# Patient Record
Sex: Male | Born: 1965 | Race: White | Hispanic: No | Marital: Single | Smoking: Never smoker
Health system: Southern US, Community
[De-identification: ages and names within clinical notes are randomized; demographics above are authoritative.]

## PROBLEM LIST (undated history)

## (undated) DIAGNOSIS — Z9049 Acquired absence of other specified parts of digestive tract: Secondary | ICD-10-CM

## (undated) DIAGNOSIS — Z789 Other specified health status: Secondary | ICD-10-CM

## (undated) DIAGNOSIS — I10 Essential (primary) hypertension: Secondary | ICD-10-CM

## (undated) DIAGNOSIS — M4726 Other spondylosis with radiculopathy, lumbar region: Secondary | ICD-10-CM

## (undated) DIAGNOSIS — M4316 Spondylolisthesis, lumbar region: Secondary | ICD-10-CM

## (undated) DIAGNOSIS — R072 Precordial pain: Secondary | ICD-10-CM

## (undated) DIAGNOSIS — E119 Type 2 diabetes mellitus without complications: Secondary | ICD-10-CM

## (undated) DIAGNOSIS — M51369 Other intervertebral disc degeneration, lumbar region without mention of lumbar back pain or lower extremity pain: Secondary | ICD-10-CM

## (undated) DIAGNOSIS — G4700 Insomnia, unspecified: Secondary | ICD-10-CM

## (undated) DIAGNOSIS — F32A Depression, unspecified: Secondary | ICD-10-CM

## (undated) DIAGNOSIS — E785 Hyperlipidemia, unspecified: Secondary | ICD-10-CM

## (undated) DIAGNOSIS — Z8619 Personal history of other infectious and parasitic diseases: Secondary | ICD-10-CM

## (undated) DIAGNOSIS — F419 Anxiety disorder, unspecified: Secondary | ICD-10-CM

## (undated) DIAGNOSIS — I2510 Atherosclerotic heart disease of native coronary artery without angina pectoris: Secondary | ICD-10-CM

## (undated) DIAGNOSIS — C801 Malignant (primary) neoplasm, unspecified: Secondary | ICD-10-CM

## (undated) DIAGNOSIS — N529 Male erectile dysfunction, unspecified: Secondary | ICD-10-CM

## (undated) DIAGNOSIS — I4510 Unspecified right bundle-branch block: Secondary | ICD-10-CM

## (undated) HISTORY — DX: Type 2 diabetes mellitus without complications: E11.9

## (undated) HISTORY — PX: COLON SURGERY: SHX602

---

## 1999-08-21 DIAGNOSIS — C801 Malignant (primary) neoplasm, unspecified: Secondary | ICD-10-CM

## 1999-08-21 HISTORY — PX: COLON SURGERY: SHX602

## 1999-08-21 HISTORY — DX: Malignant (primary) neoplasm, unspecified: C80.1

## 2000-04-19 ENCOUNTER — Ambulatory Visit (HOSPITAL_COMMUNITY): Admission: RE | Admit: 2000-04-19 | Discharge: 2000-04-19 | Payer: Self-pay | Admitting: Gastroenterology

## 2000-05-28 ENCOUNTER — Encounter: Payer: Self-pay | Admitting: Surgery

## 2000-05-29 ENCOUNTER — Inpatient Hospital Stay (HOSPITAL_COMMUNITY): Admission: RE | Admit: 2000-05-29 | Discharge: 2000-06-03 | Payer: Self-pay | Admitting: Surgery

## 2000-05-29 ENCOUNTER — Encounter (INDEPENDENT_AMBULATORY_CARE_PROVIDER_SITE_OTHER): Payer: Self-pay | Admitting: *Deleted

## 2001-12-09 ENCOUNTER — Ambulatory Visit (HOSPITAL_COMMUNITY): Admission: RE | Admit: 2001-12-09 | Discharge: 2001-12-09 | Payer: Self-pay | Admitting: Gastroenterology

## 2001-12-09 ENCOUNTER — Encounter (INDEPENDENT_AMBULATORY_CARE_PROVIDER_SITE_OTHER): Payer: Self-pay | Admitting: Specialist

## 2005-03-26 ENCOUNTER — Ambulatory Visit: Payer: Self-pay | Admitting: Internal Medicine

## 2005-05-24 ENCOUNTER — Ambulatory Visit: Payer: Self-pay | Admitting: Internal Medicine

## 2008-04-21 ENCOUNTER — Ambulatory Visit: Payer: Self-pay | Admitting: Cardiovascular Disease

## 2010-05-26 ENCOUNTER — Emergency Department: Payer: Self-pay | Admitting: Emergency Medicine

## 2010-08-23 ENCOUNTER — Ambulatory Visit
Admission: RE | Admit: 2010-08-23 | Discharge: 2010-08-23 | Payer: Self-pay | Source: Home / Self Care | Attending: Psychology | Admitting: Psychology

## 2010-08-31 ENCOUNTER — Ambulatory Visit: Admit: 2010-08-31 | Payer: Self-pay | Admitting: Psychology

## 2011-01-05 NOTE — Op Note (Signed)
[name]. [facility]  Patient:    Keith Dorsey, Keith Dorsey                       MRN: [id number] Proc. Date: 05/29/00 Adm. Date:  [id number] Attending:  Bonnetta Barry CC:         Griffith Citron, M.D.  Dr. Jettie Pagan, [facility]  Velora Heckler, M.D.   Operative Report  PREOPERATIVE DIAGNOSIS:  Sigmoid diverticular disease.  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  OPERATION PERFORMED: 1. Sigmoid colectomy. 2. Small bowel resection. 3. Appendectomy. 4. Liver wedge biopsy.  SURGEON:  Velora Heckler, M.D.  ASSISTANT:  Chevis Pretty, M.D.  ANESTHESIA:  General with postoperative epidural per Dr. Noreene Larsson.  ESTIMATED BLOOD LOSS:  100 cc.  PREPARATION:  Betadine.  COMPLICATIONS:  None.  INDICATIONS FOR PROCEDURE:  The patient is a 45 year old white male who presents with a 1-1/2 year history of diverticulitis.  The patient has undergone an extensive work-up.  He now comes to surgery for sigmoid colectomy.  The patient has also had abnormal liver function tests which have persisted over a several month interval.  He also desires liver biopsy.  DESCRIPTION OF PROCEDURE:  The procedure was done in OR #16 at the [facility].  The patient was brought to the operating room and placed in supine position on the operating room table.  Following administration of general anesthesia, the patient was prepped and draped in the usual strict aseptic fashion.  After ascertaining that an adequate level of anesthesia had been obtained, a midline incision was made with a #10 blade. Dissection was carried down through the subcutaneous tissues to the fascia. Hemostasis was obtained with the electrocautery.  Fascia was incised in the midline and the peritoneal cavity was entered cautiously.  The abdomen was explored.  There is an obvious inflammatory mass in the  distal sigmoid colon. This was adherent to the posterior wall of the bladder.  It also has adherent small bowel.  Small bowel was separated from the mass with sharp dissection using the Metzenbaum scissors.  There appears to be a fistulous tract connecting the two.  The remainder of the abdomen is normal to palpation.  The fistulous tract is in the distal ileum.  The cecum appears grossly normal as does the appendix.  The liver is normal.  Stomach is normal.  The remainder of the small bowel is normal.  Balfour retractor was placed for exposure.  We began by incising the peritoneum lateral to the sigmoid colon and up the white line of Toldt.  A point in the proximal sigmoid colon was selected and transected between bowel clamps.  The mesentery was divided between [facility] clamps and ligated with 2-0 silk ties and 2-0 silk suture ligatures. Dissection was carried distal to the inflammatory segment into the distal sigmoid colon.  Again, the bowel was transected between bowel clamps and the specimen passed off the field.  This was reviewed intraoperatively by Berneta Levins, M.D., who felt that this was consistent with diverticular disease.  Next, an end-to-end anastomosis was created with a single layer of hand sewn 3-0 silk sutures.  There was no tension on the anastomosis.  The segment of small bowel in the distal ileum was then resected.  The peritoneum of the mesentery was incised with the electrocautery on both sides. The small bowel immediately proximal to the fistulous segment was  transected with the GIA stapler.  The small bowel immediately distal to the fistulous segment approximately 10 cm from the ileocecal valve was transected with a GIA stapler.  The mesentery was divided between Spray clamps and ligated with 2-0 silk ties.  The segment was submitted to pathology for review.  A side-to-side functional end-to-end anastomosis was then created  between the two ends of the terminal ileum.   This was performed with a GIA type stapler.  Staple was inspected for hemostasis.  Enterotomy was closed with interrupted 3-0 silk sutures.  The mesenteric defect was closed with interrupted 3-0 silk sutures. Next we performed appendectomy.  The appendix was mobilized on the appendiceal mesentery.  The appendiceal artery was divided between hemostats and ligated with 2-0 silk tie.  The appendiceal mesentery was divided between [facility] clamps and ligated with 2-0 silk tie.  The base of the appendix was ligated with a 0 chromic gut suture.  The stump of the appendix was inverted with a 3-0 silk pursestring suture.  The appendix was passed off the field and submitted to pathology.  Finally, the midline incision was extended slightly cephalad. This provided for adequate exposure of the left lateral segment of the liver. Two 2-0 silk figure-of-eight sutures were placed.  Using a knife, a wedge of liver parenchyma was excised between the sutures.  Hemostasis was obtained with the electrocautery. These silk sutures were tied together to close the injury to the liver.  Good hemostasis was maintained.  The abdomen was copiously irrigated with warm saline which was evacuated.  All sponge counts were correct.  The midline wound was closed with a running #1 Novofil suture. Subcutaneous tissues were irrigated and hemostasis obtained with the electrocautery.  Skin was closed with stainless steel staples. Sterile occlusive dressings were applied.  The patient was awakened from anesthesia following placement of a postoperative epidural catheter by Dr. Noreene Larsson.  the patient was brought to the recovery room in stable condition.  The patient tolerated the procedure well. DD:  05/29/00 TD:  05/30/00 Job: [id number]

## 2011-01-05 NOTE — Discharge Summary (Signed)
[place]. [facility]  Patient:    Keith Dorsey, Keith Dorsey                       MRN: [id number] Adm. Date:  [id number] Disc. Date: 06/03/00 Attending:  Bonnetta Barry CC:         Griffith Citron, M.D.  Dr. 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  REASON FOR ADMISSION:  Diverticular disease of the sigmoid colon.  BRIEF HISTORY:  The patient is a 45 year old white male who presents with a one and a half year history of problems associated with diverticular disease and diverticulitis of the sigmoid colon. The patient has been on multiple regimens of oral antibiotics. He was seen and evaluated by Dr. Sharrell Ku, and treated with intravenous antibiotics. Followup CT scan showed persistence of his diverticular disease. Colonoscopy demonstrated a limited segment of diverticular disease involving the sigmoid colon. The patient is admitted, at this time, for sigmoid resection.  HOSPITAL COURSE:  The patient was admitted on the morning of May 29, 2000. He was taken directly to the operating room where he underwent sigmoid colectomy, small bowel resection, liver wedge biopsy and incidental appendectomy. At surgery he was found to have a short segment of diverticular disease with a fistulous connection to the terminal ileum.  The postoperative course was relatively straight forward. The patient had postoperative epidural for analgesia. He was begun on a clear liquid diet on the second postoperative day. He was advanced to a regular diet by the fourth postoperative day. He was prepared for discharge home on the fifth postoperative day.  DISCHARGE PLANNING:  The patient is discharged home today, June 03, 2000, in good condition, tolerating a regular diet, and ambulating independently. He will be seen back in my office at [facility] in three to five days for a wound check and staple removal.  DISCHARGE MEDICATIONS: 1. Tylox as needed for pain. 2. Seconal as needed for sleep.  CONDITION AT DISCHARGE:  Good. DD:  06/03/00 TD:  06/03/00 Job: [id number]

## 2011-01-05 NOTE — Procedures (Signed)
[facility]  Patient:    Keith Dorsey, Keith Dorsey                       MRN: [id number] Proc. Date: 04/19/00 Adm. Date:  [id number] Disc. Date: [id number] Attending:  Deneen Harts CC:         Dr. Jettie Pagan   Procedure Report  PROCEDURE:  Colonoscopy.  SURGEON:  Griffith Citron, M.D.  INDICATIONS:  A 45 year old white male undergoing colonoscopy to further evaluate recurrent episodes of left lower quadrant abdominal pain, alternating diarrhea, constipation, and a barium enema suggesting a possible mass or extrinsic compression in the region of the rectosigmoid.  DESCRIPTION OF PROCEDURE:  After reviewing the nature of the procedure with the patient including potential risks and complications, and after discussing alternative methods of diagnosis and treatment, informed consent was signed.  The patient was premedicated reviewing IV sedation totalling Versed 10 mg and fentanyl 100 mcg administered in divided doses prior to the onset of the procedure.  Using the Olympus PCF-140L pediatric video colonoscope, the rectum was intubated.  After a normal digital examination with normal prostate.  The scope was inserted and easily advanced through the entire length of the colon to the cecum, identified by the appendiceal orifice and the ileocecal valve.  The scope was slowly withdrawn with careful inspection after the terminal ileum was intubated.  Preparation was excellent throughout.  The only abnormality noted was in the region of the rectosigmoid colon.  In this region, over approximately 25 cm, from 25-30 cm, the mucosa was edematous, erythematous, and minimally friable.  There was purulent exudate, though no specific lesion identified.  There was suggestion of an extrinsic compression, though this was not marked.  Diverticuli were noted in the vicinity, but they were all full of purulent material in an identical manner without point source.  The  rectum was normal including retroflexed view.  The colon was decompressed and the scope withdrawn.  The patient tolerated the procedure without difficulty, being maintained on datascope monitor, and maintained on low flow oxygen throughout.  Returned to recovery room in stable condition.  Time 1, technical 1, preparation 1, and total score equalled 3.  ASSESSMENT: 1. Probable acute diverticulitis with possible diverticular abscess in the    region in the rectosigmoid. 2. Diverticulosis - sigmoid, well-localized.  RECOMMENDATIONS: 1. Cipro 250 mg p.o. b.i.d. 2. Consider repeat CT scan. 3. Return office visit in 1-2 weeks to reassess symptoms. 4. CBC with differential, sedimentation rate, and CMET. DD:  04/19/00 TD:  04/22/00 Job: [id number]

## 2011-01-05 NOTE — Procedures (Signed)
. [facility]  Patient:    Keith Dorsey, Keith Dorsey                       MRN: [id number] Proc. Date: 05/29/00 Adm. Date:  [id number] Attending:  Bonnetta Barry                           Procedure Report  PROCEDURE:  Lumbar epidural catheter for postoperative pain control.  INDICATIONS:  [name] is a 45 year old white male with a history of chronic colonic diverticulitis.  He underwent a subtotal colectomy by Velora Heckler, M.D., under general anesthesia.  Prior to the procedure, the risks and benefits of epidural pain control were discussed with the patient.  The risks included bleeding, headache, injection, nerve root injury, and spinal cord injury.  The patient understood these risks and agreed to proceed with this form of pain control following surgery.  DESCRIPTION OF PROCEDURE:  Upon completion of the procedure while the patient was still under general endotracheal anesthesia, he was turned to the right lateral decubitus position and the low back was prepped and draped sterilely. The L3-4 interspace was entered in the midline using an 18 gauge Tuohy needle. One pass of the needle was required to enter the epidural space using loss of resistance with air.  Aspiration of the needle was negative for blood or CSF. Preservative-free saline 10 cc with 50 mg of 1% lidocaine added was injected through the needle without difficulty.  The catheter was then threaded 4 cm into the epidural space.  The needle was removed without difficulty and the catheter was taped securely in place.  The patient was then extubated and brought to the recovery room in stable condition.  He was able to be begun on an epidural fentanyl and Marcaine infusion to be followed on a daily basis by the anesthesia service. DD:  05/29/00 TD:  05/29/00 Job: [id number]

## 2012-12-22 ENCOUNTER — Emergency Department: Payer: Self-pay | Admitting: Emergency Medicine

## 2012-12-22 LAB — CK TOTAL AND CKMB (NOT AT ARMC)
CK, Total: 106 U/L (ref 35–232)
CK, Total: 93 U/L (ref 35–232)
CK-MB: 1 ng/mL (ref 0.5–3.6)
CK-MB: 1 ng/mL (ref 0.5–3.6)

## 2012-12-22 LAB — TROPONIN I
Troponin-I: <0.02 ng/mL
Troponin-I: <0.02 ng/mL

## 2012-12-22 LAB — COMPREHENSIVE METABOLIC PANEL
Albumin: 3.8 g/dL (ref 3.4–5.0)
Alkaline Phosphatase: 82 U/L (ref 50–136)
Anion Gap: 6 — ABNORMAL LOW (ref 7–16)
BUN: 14 mg/dL (ref 7–18)
Bilirubin,Total: 0.3 mg/dL (ref 0.2–1.0)
Calcium, Total: 9.3 mg/dL (ref 8.5–10.1)
Chloride: 103 mmol/L (ref 98–107)
Co2: 30 mmol/L (ref 21–32)
Creatinine: 0.96 mg/dL (ref 0.60–1.30)
EGFR (African American): >60
EGFR (Non-African Amer.): >60
Glucose: 140 mg/dL — ABNORMAL HIGH (ref 65–99)
Osmolality: 280 (ref 275–301)
Potassium: 3.4 mmol/L — ABNORMAL LOW (ref 3.5–5.1)
SGOT(AST): 35 U/L (ref 15–37)
SGPT (ALT): 58 U/L (ref 12–78)
Sodium: 139 mmol/L (ref 136–145)
Total Protein: 8 g/dL (ref 6.4–8.2)

## 2012-12-22 LAB — CBC
HCT: 44 % (ref 40.0–52.0)
HGB: 15.1 g/dL (ref 13.0–18.0)
MCH: 31.1 pg (ref 26.0–34.0)
MCHC: 34.3 g/dL (ref 32.0–36.0)
MCV: 91 fL (ref 80–100)
Platelet: 193 10*3/uL (ref 150–440)
RBC: 4.86 10*6/uL (ref 4.40–5.90)
RDW: 13 % (ref 11.5–14.5)
WBC: 8.2 10*3/uL (ref 3.8–10.6)

## 2014-01-26 ENCOUNTER — Ambulatory Visit: Payer: Self-pay | Admitting: Family Medicine

## 2014-02-17 ENCOUNTER — Ambulatory Visit: Payer: Self-pay | Admitting: Family Medicine

## 2015-03-21 DIAGNOSIS — E119 Type 2 diabetes mellitus without complications: Secondary | ICD-10-CM | POA: Insufficient documentation

## 2015-06-14 ENCOUNTER — Other Ambulatory Visit: Payer: Self-pay | Admitting: Family Medicine

## 2015-06-30 ENCOUNTER — Emergency Department (HOSPITAL_COMMUNITY)
Admission: EM | Admit: 2015-06-30 | Discharge: 2015-06-30 | Disposition: A | Discharge status: Home / Self Care | Payer: BLUE CROSS/BLUE SHIELD | Attending: Emergency Medicine | Admitting: Emergency Medicine

## 2015-06-30 ENCOUNTER — Emergency Department (HOSPITAL_COMMUNITY): Payer: BLUE CROSS/BLUE SHIELD

## 2015-06-30 ENCOUNTER — Encounter (HOSPITAL_COMMUNITY): Payer: Self-pay | Admitting: *Deleted

## 2015-06-30 DIAGNOSIS — R51 Headache: Secondary | ICD-10-CM | POA: Diagnosis not present

## 2015-06-30 DIAGNOSIS — R358 Other polyuria: Secondary | ICD-10-CM | POA: Insufficient documentation

## 2015-06-30 DIAGNOSIS — Z85038 Personal history of other malignant neoplasm of large intestine: Secondary | ICD-10-CM | POA: Insufficient documentation

## 2015-06-30 DIAGNOSIS — A770 Spotted fever due to Rickettsia rickettsii: Secondary | ICD-10-CM

## 2015-06-30 DIAGNOSIS — R112 Nausea with vomiting, unspecified: Secondary | ICD-10-CM | POA: Insufficient documentation

## 2015-06-30 DIAGNOSIS — R0602 Shortness of breath: Secondary | ICD-10-CM | POA: Insufficient documentation

## 2015-06-30 DIAGNOSIS — R509 Fever, unspecified: Secondary | ICD-10-CM | POA: Diagnosis present

## 2015-06-30 DIAGNOSIS — R634 Abnormal weight loss: Secondary | ICD-10-CM | POA: Diagnosis not present

## 2015-06-30 DIAGNOSIS — Z79899 Other long term (current) drug therapy: Secondary | ICD-10-CM | POA: Insufficient documentation

## 2015-06-30 HISTORY — DX: Malignant (primary) neoplasm, unspecified: C80.1

## 2015-06-30 LAB — URINALYSIS, ROUTINE W REFLEX MICROSCOPIC
Bilirubin Urine: NEGATIVE
GLUCOSE, UA: 250 mg/dL — AB
Hgb urine dipstick: NEGATIVE
Ketones, ur: NEGATIVE mg/dL
LEUKOCYTES UA: NEGATIVE
NITRITE: NEGATIVE
PH: 6 (ref 5.0–8.0)
Protein, ur: NEGATIVE mg/dL
SPECIFIC GRAVITY, URINE: 1.016 (ref 1.005–1.030)
UROBILINOGEN UA: 1 mg/dL (ref 0.0–1.0)

## 2015-06-30 LAB — CBC WITH DIFFERENTIAL/PLATELET
BASOS PCT: 2 %
Basophils Absolute: 0.1 10*3/uL (ref 0.0–0.1)
Eosinophils Absolute: 0.1 10*3/uL (ref 0.0–0.7)
Eosinophils Relative: 2 %
HEMATOCRIT: 42.9 % (ref 39.0–52.0)
HEMOGLOBIN: 14.7 g/dL (ref 13.0–17.0)
LYMPHS ABS: 1.3 10*3/uL (ref 0.7–4.0)
Lymphocytes Relative: 29 %
MCH: 30.9 pg (ref 26.0–34.0)
MCHC: 34.3 g/dL (ref 30.0–36.0)
MCV: 90.3 fL (ref 78.0–100.0)
MONO ABS: 0.5 10*3/uL (ref 0.1–1.0)
MONOS PCT: 11 %
NEUTROS ABS: 2.5 10*3/uL (ref 1.7–7.7)
NEUTROS PCT: 55 %
Platelets: 86 10*3/uL — ABNORMAL LOW (ref 150–400)
RBC: 4.75 MIL/uL (ref 4.22–5.81)
RDW: 12.4 % (ref 11.5–15.5)
WBC: 4.6 10*3/uL (ref 4.0–10.5)

## 2015-06-30 LAB — COMPREHENSIVE METABOLIC PANEL
ALBUMIN: 3.2 g/dL — AB (ref 3.5–5.0)
ALK PHOS: 62 U/L (ref 38–126)
ALT: 57 U/L (ref 17–63)
ANION GAP: 9 (ref 5–15)
AST: 73 U/L — ABNORMAL HIGH (ref 15–41)
BILIRUBIN TOTAL: 0.8 mg/dL (ref 0.3–1.2)
BUN: 11 mg/dL (ref 6–20)
CALCIUM: 9.1 mg/dL (ref 8.9–10.3)
CO2: 28 mmol/L (ref 22–32)
Chloride: 96 mmol/L — ABNORMAL LOW (ref 101–111)
Creatinine, Ser: 0.96 mg/dL (ref 0.61–1.24)
Glucose, Bld: 224 mg/dL — ABNORMAL HIGH (ref 65–99)
POTASSIUM: 3.7 mmol/L (ref 3.5–5.1)
Sodium: 133 mmol/L — ABNORMAL LOW (ref 135–145)
TOTAL PROTEIN: 7.3 g/dL (ref 6.5–8.1)

## 2015-06-30 MED ORDER — HYDROMORPHONE HCL 1 MG/ML IJ SOLN
1.0000 mg | Freq: Once | INTRAMUSCULAR | Status: AC
  Administered 2015-06-30: 1 mg via INTRAVENOUS
  Filled 2015-06-30: qty 1

## 2015-06-30 MED ORDER — PROMETHAZINE HCL 25 MG/ML IJ SOLN
12.5000 mg | Freq: Once | INTRAMUSCULAR | Status: AC
  Administered 2015-06-30: 12.5 mg via INTRAVENOUS
  Filled 2015-06-30: qty 1

## 2015-06-30 MED ORDER — IOHEXOL 300 MG/ML  SOLN
100.0000 mL | Freq: Once | INTRAMUSCULAR | Status: AC | PRN
  Administered 2015-06-30: 100 mL via INTRAVENOUS

## 2015-06-30 MED ORDER — ACETAMINOPHEN 325 MG PO TABS
650.0000 mg | ORAL_TABLET | Freq: Once | ORAL | Status: DC
  Filled 2015-06-30: qty 2

## 2015-06-30 MED ORDER — OXYCODONE HCL 5 MG PO CAPS: 5.0000 mg | ORAL_CAPSULE | ORAL | Status: DC | PRN

## 2015-06-30 MED ORDER — FENTANYL CITRATE (PF) 100 MCG/2ML IJ SOLN: 50.0000 ug | Freq: Once | INTRAMUSCULAR | Status: DC

## 2015-06-30 MED ORDER — SODIUM CHLORIDE 0.9 % IV BOLUS (SEPSIS)
1000.0000 mL | Freq: Once | INTRAVENOUS | Status: AC
Start: 2015-06-30 — End: 2015-06-30
  Administered 2015-06-30: 1000 mL via INTRAVENOUS

## 2015-06-30 MED ORDER — DEXTROSE 5 % IV SOLN
100.0000 mg | Freq: Once | INTRAVENOUS | Status: DC
Start: 2015-06-30 — End: 2015-07-01
  Filled 2015-06-30: qty 100

## 2015-06-30 MED ORDER — DOXYCYCLINE HYCLATE 100 MG PO TABS: 100.0000 mg | ORAL_TABLET | Freq: Two times a day (BID) | ORAL | Status: DC

## 2015-06-30 NOTE — ED Provider Notes (Signed)
CSN: [id number]     Arrival date & time 06/30/15  1438 History   First MD Initiated Contact with Patient 06/30/15 1612     Chief Complaint  Patient presents with  . Fever  . Headache     (Consider location/radiation/quality/duration/timing/severity/associated sxs/prior Treatment) The history is provided by the patient.   Patient is a 49yo male with history of colon cancer s/p resection in 2001 and T2DM presenting with fever and headache for 3 days. Patient describes the pain as "pressure" located in the front of his head. Pain waxes and wanes, though is constant in nature. Patient has never had a headache like this before. Patient denies any acute vision disturbances, weakness, numbness, or syncope. Patient has tried taking aleve which helps for a couple of hours then wears off. Patient has not noticed anything else that makes the pain better or worse. Mild photophobia. Patient also reports high grade fever for the past 3 days. States that his temperature has been up to 101-103F orally. Headache and fever is usually worse at night. Patient also reports an unintentional 15 pound weight loss over the past month. No tick bites. No sick contacts. Patient did have one episode of emesis yesterday. Patient went to urgent care yesterday with the same complaints and reports that he was only told that he had a normal white count, low platelets, and slightly elevated liver enzymes. Of note, patient's wife reports that she pulled 2 engorged ticks off their dog earlier this week.    Past Medical History  Diagnosis Date  . Cancer [facility])    Past Surgical History  Procedure Laterality Date  . Colon surgery     No family history on file. Social History  Substance Use Topics  . Smoking status: Never Smoker   . Smokeless tobacco: None  . Alcohol Use: 15.0 - 18.0 oz/week    25-30 Cans of beer per week    Review of Systems  Constitutional: Positive for fever and unexpected weight change.  Eyes:  Negative.   Respiratory: Positive for shortness of breath (with high fever).   Cardiovascular: Negative.   Gastrointestinal: Positive for nausea and vomiting.  Endocrine: Positive for polyuria.  Genitourinary: Negative for dysuria and frequency.  Musculoskeletal: Negative.   Skin: Negative.   Neurological: Positive for headaches. Negative for dizziness, seizures, syncope, weakness, light-headedness and numbness.  Hematological: Negative.   Psychiatric/Behavioral: Negative.       Allergies  Other  Home Medications   Prior to Admission medications   Medication Sig Start Date End Date Taking? Authorizing Provider  diphenhydrAMINE (BENADRYL) 50 MG capsule Take 50 mg by mouth at bedtime. Take every night per patient   Yes Historical Provider, MD  sertraline (ZOLOFT) 50 MG tablet Take 50 mg by mouth daily.   Yes Historical Provider, MD  doxycycline (VIBRA-TABS) 100 MG tablet Take 1 tablet (100 mg total) by mouth 2 (two) times daily. 06/30/15   Vivi Barrack, MD  oxycodone (OXY-IR) 5 MG capsule Take 1 capsule (5 mg total) by mouth every 4 (four) hours as needed. 06/30/15   Vivi Barrack, MD   BP 144/100 mmHg  Pulse 65  Temp(Src) 99.4 F (37.4 C) (Oral)  Resp 18  Ht 5\' 8"  (1.727 m)  Wt 194 lb 2 oz (88.055 kg)  BMI 29.52 kg/m2  SpO2 92% Physical Exam  Constitutional: He is oriented to person, place, and time. He appears well-developed and well-nourished. No distress.  HENT:  Head: Normocephalic and atraumatic.  Eyes:  EOM are normal. Pupils are equal, round, and reactive to light.  Neck: Normal range of motion. Neck supple.  Cardiovascular: Normal rate, regular rhythm and normal heart sounds.   Pulmonary/Chest: Effort normal and breath sounds normal. No respiratory distress. He has no wheezes.  Abdominal: Soft. Bowel sounds are normal. He exhibits no distension. There is tenderness. There is positive Murphy's sign.  Musculoskeletal: Normal range of motion. He exhibits no edema.   Neurological: He is alert and oriented to person, place, and time. He has normal strength. No cranial nerve deficit or sensory deficit.  Skin: Skin is warm and dry. No rash noted.  Psychiatric: He has a normal mood and affect. His behavior is normal.  Nursing note and vitals reviewed.   ED Course  Procedures (including critical care time) Labs Review Labs Reviewed  CBC WITH DIFFERENTIAL/PLATELET - Abnormal; Notable for the following:    Platelets 86 (*)    All other components within normal limits  COMPREHENSIVE METABOLIC PANEL - Abnormal; Notable for the following:    Sodium 133 (*)    Chloride 96 (*)    Glucose, Bld 224 (*)    Albumin 3.2 (*)    AST 73 (*)    All other components within normal limits  URINALYSIS, ROUTINE W REFLEX MICROSCOPIC (NOT AT [facility]) - Abnormal; Notable for the following:    Glucose, UA 250 (*)    All other components within normal limits    Imaging Review Dg Chest 2 View  06/30/2015  CLINICAL DATA:  Chest pain and fever. Shortness of breath and headaches for 3 days. EXAM: CHEST  2 VIEW COMPARISON:  12/22/2012 FINDINGS: The cardiac silhouette, mediastinal and hilar contours are normal and stable. The lungs are clear. Stable mild eventration of the right hemidiaphragm. No pleural effusion. The bony thorax is intact. IMPRESSION: No acute cardiopulmonary findings. Electronically Signed   By: Marijo Sanes M.D.   On: 06/30/2015 17:20   Ct Head Wo Contrast  06/30/2015  CLINICAL DATA:  Frontal headache with blurred vision ; fever for 3 days EXAM: CT HEAD WITHOUT CONTRAST TECHNIQUE: Contiguous axial images were obtained from the base of the skull through the vertex without intravenous contrast. COMPARISON:  None. FINDINGS: The ventricles are normal in size and configuration. There is no intracranial mass, hemorrhage, extra-axial fluid collection, or midline shift. Gray-white compartments are normal. No acute infarct evident. The bony calvarium appears intact. The  mastoid air cells are clear. IMPRESSION: Study within normal limits. Electronically Signed   By: Lowella Grip III M.D.   On: 06/30/2015 17:01   Ct Abdomen Pelvis W Contrast  06/30/2015  CLINICAL DATA:  Fever and abdominal pain for 4 days. EXAM: CT ABDOMEN AND PELVIS WITH CONTRAST TECHNIQUE: Multidetector CT imaging of the abdomen and pelvis was performed using the standard protocol following bolus administration of intravenous contrast. CONTRAST:  122mL OMNIPAQUE IOHEXOL 300 MG/ML  SOLN COMPARISON:  None. FINDINGS: Lower chest: The lung bases are clear of acute process. No pleural effusion or pulmonary lesions. The heart is normal in size. No pericardial effusion. Coronary artery calcifications are noted. The distal esophagus and aorta are unremarkable. Hepatobiliary: Diffuse fatty infiltration of the liver but no focal hepatic lesions or intrahepatic biliary dilatation. The gallbladder is normal. No common bile duct dilatation. Pancreas: Normal. Spleen: Normal size.  No focal lesions. Adrenals/Urinary Tract: The adrenal glands are normal. Both kidneys are normal except for small cysts. No hydronephrosis. No obstructing ureteral calculi or bladder calculi. No renal or  bladder mass. Stomach/Bowel: The stomach, duodenum, small bowel and colon are unremarkable. No inflammatory changes, mass lesions or obstructive findings. The terminal ileum is normal. Prior appendectomy. Vascular/Lymphatic: No mesenteric or retroperitoneal mass or adenopathy. Small scattered lymph nodes are noted. The aorta is normal in caliber. The branch vessels are patent. Minimal scattered atherosclerotic calcifications. The major venous structures are patent. Other: The bladder, prostate gland and seminal vesicles are unremarkable. No pelvic mass or adenopathy. No free pelvic fluid collections. No inguinal mass or adenopathy. Musculoskeletal: No significant bony findings. Bilateral pars defects are noted at L3 but no spondylolisthesis.  IMPRESSION: No acute abdominal/ pelvic findings, mass lesions or adenopathy. Diffuse fatty infiltration of the liver. Electronically Signed   By: Marijo Sanes M.D.   On: 06/30/2015 20:54   I have personally reviewed and evaluated these images and lab results as part of my medical decision-making.    MDM   Final diagnoses:  RMSF [facility] spotted fever)   4:50 PM Patient is a 49yo male with history of colon cancer s/p resection in 2001 and T2DM presenting with 3 days of fever and headache. Patient currently nontoxic appearing with normal physical exam. No localizing signs of infection. No meningeal signs. Will check CBC, CMP, UA, CXR, head CT, and abd/pelvis CT.  9:46 PM Imaging notable for fatty liver disease, otherwise unremarkable. No clear source for fevers, though given tick exposure, will treat for RMSF. Will give first dose of doxycycline tonight, and discharge home to finish a 10 day course. Will give prescription for oxycodone to treat headache. Instructed patient to alternate ibuprofen and tylenol as needed for his fever. Patient has an appointment with his new PCP on 07/11/2015. Return precautions reviewed.    Vivi Barrack, MD 06/30/15 2152  Leonard Schwartz, MD 06/30/15 [phone]

## 2015-06-30 NOTE — ED Notes (Signed)
Pt left with all his belongings and was wheeled out of the treatment area.  

## 2015-06-30 NOTE — ED Notes (Signed)
Patient transported to X-ray 

## 2015-06-30 NOTE — Discharge Instructions (Signed)
[place] Spotted Fever [place] spotted fever is an illness that is spread to people by infected ticks. The illness causes flulike symptoms and a reddish-purple rash. This illness can quickly become very serious. Treatment must be started right away. When the illness is not treated right away, it can sometimes lead to long-term health problems or even death. This illness is most common during warm weather when ticks are most active. CAUSES [place] spotted fever is caused by a type of bacteria that is called Rickettsia rickettsii. This type of bacteria is carried by American dog ticks and [place] ticks. People get infected through a bite from a tick that is infected with the bacteria. The bite is painless, and it frequently goes unnoticed. The bacteria can also infect a person when tick blood or tick feces get into a person's body through damaged skin. A tick bite is not necessary for an infection to occur. People can get [place] spotted fever if they get a tick's blood or body fluids on their skin in the area of a small cut or sore. This could happen while removing a tick from another person or a dog. The infection is not contagious, and it cannot be spread (transmitted) from person to person. SIGNS AND SYMPTOMS Symptoms may begin 2-14 days after a tick bite. The most common early symptoms are:  Fever.  Muscle aches.  Headache.  Nausea.  Vomiting.  Poor appetite.  Abdominal pain. The reddish-purple rash usually appears 3-5 days after the first symptoms begin. The rash often starts on the wrists and ankles. It may then spread to the palms, the soles of the feet, the legs, and the trunk. DIAGNOSIS Diagnosis is based on a physical exam, medical history, and blood tests. Your health care provider may suspect [place] spotted fever in one of these cases:   If you have recently been bitten by a tick.  If you have been in areas that have a lot of ticks  or in areas where the disease is common. TREATMENT It is important to begin treatment right away. Treatment will usually involve the use of antibiotic medicines. In some cases, your health care provider may begin treatment before the diagnosis is confirmed. If your symptoms are severe, a hospital stay may be needed. HOME CARE INSTRUCTIONS  Rest as much as possible until you feel better.  Take medicines only as directed by your health care provider.  Take your antibiotic medicine as directed by your health care provider. Finish the antibiotic even if you start to feel better.  Drink enough fluid to keep your urine clear or pale yellow.  Keep all follow-up visits as directed by your health care provider. This is important. PREVENTION Avoiding tick bites can help to prevent this illness. Take these steps to avoid tick bites when you are outdoors:  Be aware that most ticks live in shrubs, low tree branches, and grassy areas. A tick can climb onto your body when you make contact with leaves or grass where the tick is waiting.  Wear protective clothing. Long sleeves and long pants are best.  Wear white clothes so you can see ticks more easily.  Tuck your pant legs into your socks.  If you go walking on a trail, stay in the middle of the trail to avoid brushing against bushes.  Avoid walking through areas that have long grass.  Put insect repellent on all exposed skin and along boot tops, pant legs, and sleeve cuffs.    Check clothing, hair, and skin repeatedly and before going inside.  Check family members and pets for ticks.  Brush off any ticks that are not attached.  Take a shower or a bath as soon as possible after you have been outdoors. Check your skin for ticks. The most common places on the body where ticks attach themselves are the scalp, neck, armpits, waist, and groin. You can also greatly reduce your chances of getting [place] spotted fever if you remove attached  ticks as soon as possible. To remove an attached tick, use a forceps or fine-point tweezers to detach the intact tick without leaving its mouth parts in the skin. The wound from the tick bite should be washed after the tick has been removed. SEEK MEDICAL CARE IF:  You have drainage, swelling, or increased redness or pain in the area of the rash. SEEK IMMEDIATE MEDICAL CARE IF:  You have chest pain.  You have shortness of breath.  You have a severe headache.  You have a seizure.  You have severe abdominal pain.  You are feeling confused.  You are bruising easily.  You have bleeding from your gums.  You have blood in your stool.   This information is not intended to replace advice given to you by your health care provider. Make sure you discuss any questions you have with your health care provider.   Document Released: 11/18/2000 Document Revised: 08/27/2014 Document Reviewed: 03/22/2014 Elsevier Interactive Patient Education 2016 [employer]  

## 2015-06-30 NOTE — ED Notes (Signed)
Patient transported to

## 2015-06-30 NOTE — ED Notes (Signed)
Pt reports fever & HA onset x 4 days, pt reports no relief with OTC medicines, pt reports n/v onset today x 1 episode, denies diarrhea, pt reports SOB when he has a high fever, pt denies current CP, denies cough, sore throat, & dysuria, pt reports wt loss of 16 lbs in 1 mth, pt A&O x4

## 2015-07-04 ENCOUNTER — Telehealth: Payer: Self-pay | Admitting: Physician Assistant

## 2015-07-04 ENCOUNTER — Telehealth (HOSPITAL_COMMUNITY): Payer: Self-pay

## 2015-07-04 NOTE — Telephone Encounter (Signed)
No answer LMTCB  Thanks,  -Joseline

## 2015-07-04 NOTE — Telephone Encounter (Signed)
Unfortunately that is the earliest appt I have currently unless we get a cancellation to work him into. Doxycycline is treatment for RMSF, and the only treatment. If it is not RMSF then it may be GI bug.  He needs to stay well hydrated.  Try gatorade or pedialyte to replenish nutrients.  Sometimes dehydration can make nausea persist.  He may also be having side effects from the doxycycline.  Make sure to take with food to see if that helps and may try probiotic to help stomach.  If he develops severe abdominal cramping, high fever, persistent vomiting, altered mental status and/or severe, persistent diarrhea he needs to go to the hospital.

## 2015-07-04 NOTE — Telephone Encounter (Signed)
Pt's girlfriend Maudie Mercury called and wanted to see if pt could come in sooner than his new pt appt that is scheduled for 07/11/15. Pt has been seen at urgent care for chills, body aches, fever, & nausea. Pt has been on Doxycyline since Thursday 06/30/15 and doesn't seem any better. Kim stated pt seems worse and the nausea has been server. Maudie Mercury stated that she feels she is getting the run around with Urgent Care and that they don't know what is going on. Maudie Mercury stated that one person tells them pt doesn't have [place] Spotted Fever and then another person calls and said he does have it and they are treating him. Maudie Mercury is very concerned and would like to get pt in this week if possible. I did put pt on the wait list but Maudie Mercury wanted to know if you might be able to work pt in. Please advise. Thanks TNP

## 2015-07-06 DIAGNOSIS — Z85038 Personal history of other malignant neoplasm of large intestine: Secondary | ICD-10-CM | POA: Insufficient documentation

## 2015-07-06 DIAGNOSIS — F331 Major depressive disorder, recurrent, moderate: Secondary | ICD-10-CM | POA: Insufficient documentation

## 2015-07-06 DIAGNOSIS — F5101 Primary insomnia: Secondary | ICD-10-CM | POA: Insufficient documentation

## 2015-07-11 ENCOUNTER — Ambulatory Visit: Payer: Self-pay | Admitting: Physician Assistant

## 2015-10-26 ENCOUNTER — Encounter: Payer: BLUE CROSS/BLUE SHIELD | Attending: Pediatrics | Admitting: *Deleted

## 2015-10-26 ENCOUNTER — Encounter: Payer: Self-pay | Admitting: *Deleted

## 2015-10-26 VITALS — BP 160/98 | Ht 68.0 in | Wt 195.1 lb

## 2015-10-26 DIAGNOSIS — E119 Type 2 diabetes mellitus without complications: Secondary | ICD-10-CM | POA: Insufficient documentation

## 2015-10-26 NOTE — Patient Instructions (Addendum)
Check blood sugars 2 x day before breakfast and 2 hrs after supper every day Exercise: Continue walking for 30  minutes  5 days a week Avoid sugar sweetened drinks (coffee) Eat 3 meals day, 1-2  snacks a day Space meals 4-6 hours apart Make an eye doctor appointment Bring blood sugar records to the next class Call your doctor for a prescription for:  1. Meter strips (type) One Touch Verio  checking  2 times per day  2. Lancets (type) One Touch Delica checking  2     times per day

## 2015-10-26 NOTE — Progress Notes (Signed)
Diabetes Self-Management Education  Visit Type: First/Initial  Appt. Start Time: 1335 Appt. End Time: [id number]  10/26/2015  Mr. [employer], identified by name and date of birth, is a 50 y.o. male with a diagnosis of Diabetes: Type 2.   ASSESSMENT  Blood pressure 160/98, height 5\' 8"  (1.727 m), weight 195 lb 1.6 oz (88.497 kg). Body mass index is 29.67 kg/(m^2).      Diabetes Self-Management Education - 10/26/15 1535    Visit Information   Visit Type First/Initial   Initial Visit   Diabetes Type Type 2   Are you currently following a meal plan? Yes   What type of meal plan do you follow? more vegetables   Are you taking your medications as prescribed? Yes   Date Diagnosed 1 year ago   Health Coping   How would you rate your overall health? Excellent   Psychosocial Assessment   Patient Belief/Attitude about Diabetes Motivated to manage diabetes   Self-care barriers None   Self-management support Doctor's office;Friends   Patient Concerns Nutrition/Meal planning;Glycemic Control;Problem Solving;Weight Control;Healthy Lifestyle;Monitoring;Medication   Special Needs None   Preferred Learning Style Auditory   Learning Readiness Change in progress   How often do you need to have someone help you when you read instructions, pamphlets, or other written materials from your doctor or pharmacy? 1 - Never   What is the last grade level you completed in school? [id number]    Complications   Last HgB A1C per patient/outside source 10.7 %  10/11/15   How often do you check your blood sugar? 0 times/day (not testing)  Provided One Touch Verio meter and instructed on use. BG upon return demonstration was 174 mg/dL at 2:30 pm - 3 1/2 hrs after eating a snack    Have you had a dilated eye exam in the past 12 months? No   Have you had a dental exam in the past 12 months? Yes   Are you checking your feet? No   Dietary Intake   Breakfast fruit and nuts or fruit and peanut butter   Snack (morning)  nuts   Lunch chicken breast, green beans   Snack (afternoon) [place] pepperoni or nuts   Dinner [place], meat with green breans, broccolli   Beverage(s) water, sugar sweetened coffee   Exercise   Exercise Type Light (walking / raking leaves)   How many days per week to you exercise? 5   How many minutes per day do you exercise? 30   Total minutes per week of exercise 150   Patient Education   Previous Diabetes Education No   Disease state  Definition of diabetes, type 1 and 2, and the diagnosis of diabetes   Nutrition management  Role of diet in the treatment of diabetes and the relationship between the three main macronutrients and blood glucose level   Physical activity and exercise  Role of exercise on diabetes management, blood pressure control and cardiac health.   Medications Reviewed patients medication for diabetes, action, purpose, timing of dose and side effects.   Monitoring Taught/evaluated SMBG meter.;Purpose and frequency of SMBG.;Identified appropriate SMBG and/or A1C goals.   Chronic complications Relationship between chronic complications and blood glucose control   Psychosocial adjustment Identified and addressed patients feelings and concerns about diabetes   Individualized Goals (developed by patient)   Reducing Risk Improve blood sugars Decrease medications Prevent diabetes complications Lose weight Lead a healthier lifestyle Become more fit   Outcomes   Expected Outcomes Demonstrated interest  in learning. Expect positive outcomes      Individualized Plan for Diabetes Self-Management Training:   Learning Objective:  Patient will have a greater understanding of diabetes self-management. Patient education plan is to attend individual and/or group sessions per assessed needs and concerns.   Plan:   Patient Instructions  Check blood sugars 2 x day before breakfast and 2 hrs after supper every day Exercise: Continue walking for 30  minutes  5 days a week Avoid  sugar sweetened drinks (coffee) Eat 3 meals day, 1-2  snacks a day Space meals 4-6 hours apart Make an eye doctor appointment Bring blood sugar records to the next class Call your doctor for a prescription for:  1. Meter strips (type) One Touch Verio  checking  2 times per day  2. Lancets (type) One Touch Delica checking  2     times per day   Expected Outcomes:  Demonstrated interest in learning. Expect positive outcomes  Education material provided:  General Meal Planning Guidelines Simple Meal Plan Meter - One Touch Verio  If problems or questions, patient to contact team via:   Johny Drilling, [place], [place], CDE [phone]  Future DSME appointment:  November 10, 2015 - Class 1

## 2015-11-10 ENCOUNTER — Ambulatory Visit: Payer: BLUE CROSS/BLUE SHIELD

## 2015-11-10 ENCOUNTER — Telehealth: Payer: Self-pay | Admitting: Dietician

## 2015-11-10 NOTE — Telephone Encounter (Signed)
Called patient and left a message requesting that he call to reschedule his Diabetes ISM classes.

## 2015-11-17 ENCOUNTER — Ambulatory Visit: Payer: BLUE CROSS/BLUE SHIELD

## 2015-11-24 ENCOUNTER — Ambulatory Visit: Payer: BLUE CROSS/BLUE SHIELD

## 2015-12-01 ENCOUNTER — Encounter: Payer: Self-pay | Admitting: Dietician

## 2015-12-01 ENCOUNTER — Encounter: Payer: BLUE CROSS/BLUE SHIELD | Attending: Pediatrics | Admitting: Dietician

## 2015-12-01 VITALS — Ht 68.0 in | Wt 196.7 lb

## 2015-12-01 DIAGNOSIS — E119 Type 2 diabetes mellitus without complications: Secondary | ICD-10-CM | POA: Diagnosis present

## 2015-12-01 NOTE — Progress Notes (Signed)

## 2015-12-08 ENCOUNTER — Encounter: Payer: Self-pay | Admitting: *Deleted

## 2015-12-08 NOTE — Progress Notes (Signed)
Patient didn't show for Class 2 today but came by the office to discuss further appointments. Due to his work he reports that he can come to 1:1 appointments but not able to attend 3 hr classes. He did report that he wants to complete the education. We will call next week to schedule initial appointments with the nurse and dietitian.

## 2015-12-23 ENCOUNTER — Telehealth: Payer: Self-pay | Admitting: *Deleted

## 2015-12-23 NOTE — Telephone Encounter (Signed)
Pt is not able to complete classes due to his job and requested to finish series by coming for 1:1 appointments. Left him a message earlier today to call back and schedule as we were disconnected during that call. He called back and asked if he could call on Monday May 8 to set up the appointments.

## 2016-01-02 ENCOUNTER — Other Ambulatory Visit: Payer: Self-pay | Admitting: Family Medicine

## 2016-02-06 ENCOUNTER — Encounter: Payer: Self-pay | Admitting: *Deleted

## 2016-04-03 ENCOUNTER — Emergency Department
Admission: EM | Admit: 2016-04-03 | Discharge: 2016-04-03 | Disposition: A | Discharge status: Home / Self Care | Payer: BLUE CROSS/BLUE SHIELD | Attending: Emergency Medicine | Admitting: Emergency Medicine

## 2016-04-03 DIAGNOSIS — Z7984 Long term (current) use of oral hypoglycemic drugs: Secondary | ICD-10-CM | POA: Insufficient documentation

## 2016-04-03 DIAGNOSIS — Z5321 Procedure and treatment not carried out due to patient leaving prior to being seen by health care provider: Secondary | ICD-10-CM | POA: Diagnosis not present

## 2016-04-03 DIAGNOSIS — Z043 Encounter for examination and observation following other accident: Secondary | ICD-10-CM | POA: Diagnosis present

## 2016-04-03 DIAGNOSIS — E119 Type 2 diabetes mellitus without complications: Secondary | ICD-10-CM | POA: Diagnosis not present

## 2016-04-05 ENCOUNTER — Encounter: Payer: Self-pay | Admitting: Emergency Medicine

## 2016-04-05 ENCOUNTER — Emergency Department
Admission: EM | Admit: 2016-04-05 | Discharge: 2016-04-05 | Disposition: A | Discharge status: Home / Self Care | Payer: BLUE CROSS/BLUE SHIELD | Attending: Emergency Medicine | Admitting: Emergency Medicine

## 2016-04-05 DIAGNOSIS — Z85038 Personal history of other malignant neoplasm of large intestine: Secondary | ICD-10-CM | POA: Insufficient documentation

## 2016-04-05 DIAGNOSIS — Y999 Unspecified external cause status: Secondary | ICD-10-CM | POA: Insufficient documentation

## 2016-04-05 DIAGNOSIS — Y939 Activity, unspecified: Secondary | ICD-10-CM | POA: Diagnosis not present

## 2016-04-05 DIAGNOSIS — S46001A Unspecified injury of muscle(s) and tendon(s) of the rotator cuff of right shoulder, initial encounter: Secondary | ICD-10-CM | POA: Insufficient documentation

## 2016-04-05 DIAGNOSIS — E119 Type 2 diabetes mellitus without complications: Secondary | ICD-10-CM | POA: Diagnosis not present

## 2016-04-05 DIAGNOSIS — S4991XA Unspecified injury of right shoulder and upper arm, initial encounter: Secondary | ICD-10-CM | POA: Diagnosis present

## 2016-04-05 DIAGNOSIS — W1839XA Other fall on same level, initial encounter: Secondary | ICD-10-CM | POA: Diagnosis not present

## 2016-04-05 DIAGNOSIS — Y929 Unspecified place or not applicable: Secondary | ICD-10-CM | POA: Insufficient documentation

## 2016-04-05 DIAGNOSIS — Z7984 Long term (current) use of oral hypoglycemic drugs: Secondary | ICD-10-CM | POA: Diagnosis not present

## 2016-04-05 MED ORDER — KETOROLAC TROMETHAMINE 30 MG/ML IJ SOLN
30.0000 mg | Freq: Once | INTRAMUSCULAR | Status: AC
  Administered 2016-04-05: 30 mg via INTRAMUSCULAR
  Filled 2016-04-05: qty 1

## 2016-04-05 MED ORDER — NAPROXEN 500 MG PO TABS: 500.0000 mg | ORAL_TABLET | Freq: Two times a day (BID) | ORAL | 2 refills | Status: DC

## 2016-04-05 MED ORDER — HYDROCODONE-ACETAMINOPHEN 5-325 MG PO TABS: 1.0000 | ORAL_TABLET | ORAL | 0 refills | Status: DC | PRN

## 2016-04-05 NOTE — ED Provider Notes (Signed)
[facility] Emergency Department Provider Note   ____________________________________________    I have reviewed the triage vital signs and the nursing notes.   HISTORY  Chief Complaint Shoulder Injury     HPI Keith Dorsey is a 50 y.o. male who presents with complaints of right shoulder pain. Patient reports he fell several days ago and since that time he has had significant severe pain in his right shoulder. He reports he is unable to fully abduct his right arm and it is painful with any range of motion. He denies neck pain. He denies back pain. No numbness or tingling. No focal deficits. No headache. No other injury sustained. He had x-rays done at urgent care which were negative for fracture or dislocation   Past Medical History:  Diagnosis Date  . Cancer [facility]) 2001   Colon  . Diabetes mellitus without complication ([place])     There are no active problems to display for this patient.   Past Surgical History:  Procedure Laterality Date  . COLON SURGERY      Prior to Admission medications   Medication Sig Start Date End Date Taking? Authorizing Provider  DiphenhydrAMINE HCl, Sleep, (UNISOM SLEEPGELS) 50 MG CAPS Take 2 capsules by mouth at bedtime.    Historical Provider, MD  HYDROcodone-acetaminophen (NORCO/VICODIN) 5-325 MG tablet Take 1 tablet by mouth every 4 (four) hours as needed for moderate pain. 04/05/16   Lavonia Drafts, MD  metFORMIN (GLUCOPHAGE) 500 MG tablet Take 500 mg by mouth 2 (two) times daily with a meal.  10/11/15 10/10/16  Historical Provider, MD  naproxen (NAPROSYN) 500 MG tablet Take 1 tablet (500 mg total) by mouth 2 (two) times daily with a meal. 04/05/16   Lavonia Drafts, MD  sertraline (ZOLOFT) 50 MG tablet Take 50 mg by mouth daily.    Historical Provider, MD     Allergies Other  No family history on file.  Social History Social History  Substance Use Topics  . Smoking status: Never Smoker  . Smokeless tobacco:  Never Used  . Alcohol use 14.4 oz/week    24 Cans of beer per week     Comment: "light beer"    Review of Systems  Constitutional: No Dizziness  ENT: No neck pain   Gastrointestinal: No abdominal pain.    Genitourinary: Negative for dysuria. Musculoskeletal: Negative for back pain. Skin: Negative for bruising Neurological: Negative for headaches , no focal deficits    ____________________________________________   PHYSICAL EXAM:  VITAL SIGNS: ED Triage Vitals  Enc Vitals Group     BP 04/05/16 0936 (!) 184/113     Pulse Rate 04/05/16 0936 73     Resp 04/05/16 0936 16     Temp 04/05/16 0936 98.9 F (37.2 C)     Temp Source 04/05/16 0936 Oral     SpO2 04/05/16 0936 96 %     Weight 04/05/16 0938 190 lb (86.2 kg)     Height 04/05/16 0938 5\' 8"  (1.727 m)     Head Circumference --      Peak Flow --      Pain Score 04/05/16 0938 9     Pain Loc --      Pain Edu? --      Excl. in [place]? --     Constitutional: Alert and oriented. No acute distress. Pleasant and interactive Eyes: Conjunctivae are normal.  Head: Atraumatic. Nose: No congestion/rhinnorhea. Mouth/Throat: Mucous membranes are moist.   Cardiovascular: Normal rate, regular  rhythm.  Respiratory: Normal respiratory effort.  No retractions. Genitourinary: deferred Musculoskeletal: Patient is unable to abduct his right arm approximately 40 secondary to pain. 2+ distal pulses. No bony abnormalities felt. Mildly Tender over the right trapezius muscle Neurologic:  Normal speech and language. No gross focal neurologic deficits are appreciated.   Skin:  Skin is warm, dry and intact. No rash noted.   ____________________________________________   LABS (all labs ordered are listed, but only abnormal results are displayed)  Labs Reviewed - No data to  display ____________________________________________  EKG   ____________________________________________  RADIOLOGY  None ____________________________________________   PROCEDURES  Procedure(s) performed: No    Critical Care performed: No ____________________________________________   INITIAL IMPRESSION / ASSESSMENT AND PLAN / ED COURSE  Pertinent labs & imaging results that were available during my care of the patient were reviewed by me and considered in my medical decision making (see chart for details).  Exam most consistent with rotator cuff injury. Will place patient in a sling, give Toradol IM, discharged home with analgesics with close orthopedic follow-up   ____________________________________________   FINAL CLINICAL IMPRESSION(S) / ED DIAGNOSES  Final diagnoses:  Rotator cuff injury, right, initial encounter      NEW MEDICATIONS STARTED DURING THIS VISIT:  New Prescriptions   HYDROCODONE-ACETAMINOPHEN (NORCO/VICODIN) 5-325 MG TABLET    Take 1 tablet by mouth every 4 (four) hours as needed for moderate pain.   NAPROXEN (NAPROSYN) 500 MG TABLET    Take 1 tablet (500 mg total) by mouth 2 (two) times daily with a meal.     Note:  This document was prepared using Dragon voice recognition software and may include unintentional dictation errors.    Lavonia Drafts, MD 04/05/16 1034

## 2016-04-05 NOTE — ED Notes (Signed)
States he took a fall on Tuesday  Landed on right shoulder  Was seen at an urgent care yesterday  States pain is worse and is not about to move arm d/t increased pain

## 2016-04-05 NOTE — ED Triage Notes (Signed)
States fell a couple nights ago when blood sugar dropped.  C/O right shoulder pain.

## 2016-04-06 ENCOUNTER — Other Ambulatory Visit: Payer: Self-pay | Admitting: Specialist

## 2016-04-06 DIAGNOSIS — S46011A Strain of muscle(s) and tendon(s) of the rotator cuff of right shoulder, initial encounter: Secondary | ICD-10-CM

## 2016-04-18 ENCOUNTER — Ambulatory Visit: Admission: RE | Admit: 2016-04-18 | Payer: BLUE CROSS/BLUE SHIELD | Source: Ambulatory Visit

## 2016-04-18 ENCOUNTER — Other Ambulatory Visit: Payer: Self-pay | Admitting: Orthopedic Surgery

## 2016-04-18 DIAGNOSIS — M542 Cervicalgia: Secondary | ICD-10-CM

## 2016-04-18 DIAGNOSIS — M25511 Pain in right shoulder: Secondary | ICD-10-CM

## 2016-04-19 ENCOUNTER — Ambulatory Visit
Admission: RE | Admit: 2016-04-19 | Discharge: 2016-04-19 | Disposition: A | Discharge status: Home / Self Care | Payer: BLUE CROSS/BLUE SHIELD | Source: Ambulatory Visit | Attending: Orthopedic Surgery | Admitting: Orthopedic Surgery

## 2016-04-19 DIAGNOSIS — M25511 Pain in right shoulder: Secondary | ICD-10-CM

## 2016-04-19 DIAGNOSIS — M50222 Other cervical disc displacement at C5-C6 level: Secondary | ICD-10-CM | POA: Insufficient documentation

## 2016-04-19 DIAGNOSIS — M50223 Other cervical disc displacement at C6-C7 level: Secondary | ICD-10-CM | POA: Insufficient documentation

## 2016-04-19 DIAGNOSIS — M50221 Other cervical disc displacement at C4-C5 level: Secondary | ICD-10-CM | POA: Diagnosis not present

## 2016-04-19 DIAGNOSIS — M542 Cervicalgia: Secondary | ICD-10-CM

## 2016-04-19 LAB — POCT I-STAT CREATININE: Creatinine, Ser: 0.8 mg/dL (ref 0.61–1.24)

## 2016-04-19 MED ORDER — GADOBENATE DIMEGLUMINE 529 MG/ML IV SOLN
20.0000 mL | Freq: Once | INTRAVENOUS | Status: AC | PRN
  Administered 2016-04-19: 18 mL via INTRAVENOUS

## 2016-04-27 ENCOUNTER — Ambulatory Visit: Admission: RE | Admit: 2016-04-27 | Payer: BLUE CROSS/BLUE SHIELD | Source: Ambulatory Visit

## 2017-03-19 DIAGNOSIS — E782 Mixed hyperlipidemia: Secondary | ICD-10-CM | POA: Insufficient documentation

## 2017-03-19 DIAGNOSIS — I10 Essential (primary) hypertension: Secondary | ICD-10-CM | POA: Insufficient documentation

## 2018-04-07 ENCOUNTER — Encounter: Payer: Self-pay | Admitting: Emergency Medicine

## 2018-04-07 ENCOUNTER — Emergency Department: Payer: BLUE CROSS/BLUE SHIELD

## 2018-04-07 ENCOUNTER — Emergency Department
Admission: EM | Admit: 2018-04-07 | Discharge: 2018-04-07 | Disposition: A | Discharge status: Home / Self Care | Payer: BLUE CROSS/BLUE SHIELD | Attending: Emergency Medicine | Admitting: Emergency Medicine

## 2018-04-07 DIAGNOSIS — Z79899 Other long term (current) drug therapy: Secondary | ICD-10-CM | POA: Insufficient documentation

## 2018-04-07 DIAGNOSIS — R42 Dizziness and giddiness: Secondary | ICD-10-CM | POA: Diagnosis present

## 2018-04-07 DIAGNOSIS — Z85038 Personal history of other malignant neoplasm of large intestine: Secondary | ICD-10-CM | POA: Insufficient documentation

## 2018-04-07 DIAGNOSIS — Z7984 Long term (current) use of oral hypoglycemic drugs: Secondary | ICD-10-CM | POA: Diagnosis not present

## 2018-04-07 DIAGNOSIS — I10 Essential (primary) hypertension: Secondary | ICD-10-CM | POA: Insufficient documentation

## 2018-04-07 DIAGNOSIS — E119 Type 2 diabetes mellitus without complications: Secondary | ICD-10-CM | POA: Insufficient documentation

## 2018-04-07 LAB — BASIC METABOLIC PANEL
Anion gap: 11 (ref 5–15)
BUN: 14 mg/dL (ref 6–20)
CHLORIDE: 97 mmol/L — AB (ref 98–111)
CO2: 29 mmol/L (ref 22–32)
Calcium: 9.5 mg/dL (ref 8.9–10.3)
Creatinine, Ser: 0.96 mg/dL (ref 0.61–1.24)
GFR calc Af Amer: >60 mL/min (ref >60)
GLUCOSE: 166 mg/dL — AB (ref 70–99)
POTASSIUM: 4.7 mmol/L (ref 3.5–5.1)
SODIUM: 137 mmol/L (ref 135–145)

## 2018-04-07 LAB — CBC
HEMATOCRIT: 46.3 % (ref 40.0–52.0)
HEMOGLOBIN: 16.3 g/dL (ref 13.0–18.0)
MCH: 32.7 pg (ref 26.0–34.0)
MCHC: 35.2 g/dL (ref 32.0–36.0)
MCV: 92.7 fL (ref 80.0–100.0)
Platelets: 176 10*3/uL (ref 150–440)
RBC: 4.99 MIL/uL (ref 4.40–5.90)
RDW: 12.9 % (ref 11.5–14.5)
WBC: 7.8 10*3/uL (ref 3.8–10.6)

## 2018-04-07 LAB — URINALYSIS, COMPLETE (UACMP) WITH MICROSCOPIC
BACTERIA UA: NONE SEEN
Bilirubin Urine: NEGATIVE
Glucose, UA: NEGATIVE mg/dL
HGB URINE DIPSTICK: NEGATIVE
KETONES UR: NEGATIVE mg/dL
Leukocytes, UA: NEGATIVE
NITRITE: NEGATIVE
Protein, ur: NEGATIVE mg/dL
Specific Gravity, Urine: 1.023 (ref 1.005–1.030)
pH: 5 (ref 5.0–8.0)

## 2018-04-07 LAB — TROPONIN I

## 2018-04-07 MED ORDER — SODIUM CHLORIDE 0.9 % IV BOLUS: 1000.0000 mL | Freq: Once | INTRAVENOUS | Status: DC

## 2018-04-07 NOTE — ED Triage Notes (Signed)
Patient presents to the ED after having several episodes of dizziness this morning.  Patient states, "I felt like I couldn't walk, like I was leaning when I wasn't.  I was having to hold onto the wall."  Patient also reports, "a funny feeling in my head and chest."  Patient denies any blurry vision.  Patient has history of diabetes and hypertension.

## 2018-04-07 NOTE — Discharge Instructions (Signed)
While your exam and your findings thus far are normal, except for your blood pressure which is elevated, although it is the same as it was last time you were here, we have advised further testing.  You would prefer not to have that done which is certainly your choice.  However, it does limit our ability to work you up for other entities including stroke and other things that we discussed.  This is certainly your choice.  We are happy to have you return to the emergency room at any time if you change your mind or you feel worse.  If you do feel worse including chest pain, shortness of breath, numbness, weakness difficulty walking or talking or other concerns please return to the emergency room right away we will be happy to see you and continue the work-up at that time.  Otherwise, please call your doctor today for an appointment as soon as possible.  I would avoid a drinking beer in the heat, and I would try to stay as hydrated as possible

## 2018-04-07 NOTE — ED Notes (Signed)
Pt ambulatory upon discharge; declined wheel chair. Verbalized understanding of discharge instructions and follow-up care. BP elevated, MD aware. Skin warm and dry. A&Ox4.

## 2018-04-07 NOTE — ED Triage Notes (Signed)
C/O feeling dizzy since around 0830 this morning.  States he was working at a [employer] site and after taking his hard hat off, felt like it was still there.  States dizziness is intermittent.    Patient is AAOx3.  Skin warm and dry.  MAE equally and strong.  Gait steady.  Posture upright and relaxed.  Speech clear. Facial movement equal.

## 2018-04-07 NOTE — ED Notes (Signed)
Pt reports feeling like he was leaning when walking into his office this morning. States he then went to a job site, felt as though . States "I just don't feel right." Reports pressure in his chest, headache, pain in left side of neck. Took tylenol with some relief in head but "still has a headache."  States he had to hold onto counters when he walked into his house. Could not walk without support.

## 2018-04-07 NOTE — ED Notes (Signed)
Pt ambulatory to XR and back to room ED33 with steady gait.

## 2018-04-07 NOTE — ED Provider Notes (Addendum)
[facility] Emergency Department Provider Note  ____________________________________________   I have reviewed the triage vital signs and the nursing notes. Where available I have reviewed prior notes and, if possible and indicated, outside hospital notes.    HISTORY  Chief Complaint Dizziness    HPI Keith Dorsey is a 52 y.o. male  Hypertension and diabetes, who is complaint he states with his medications, last blood pressure when he was in the emergency room was 180/110 a year ago for shoulder complaint, presents today with multiple complaints.  He states he feels 100% better now though.  Patient states he was in the triage in the heat working during the evening last night, had 6 beers, and today he was lightheaded.  He states that he is no longer is.  He drank a bunch of fluid and feels better.  He had no focal numbness or weakness.  He states he had abdominal discomfort and feeling of discomfort in his chest from 9:00 until about 10:00, and that went away.  He also states that when he took his hat off and felt like he was still wearing his hat.  He denies severe headache focal numbness or weakness, and he states he does not want to stay for further testing at this time.  States he feels much better after fluids.     Past Medical History:  Diagnosis Date  . Cancer [facility]) 2001   Colon  . Diabetes mellitus without complication ([place])     There are no active problems to display for this patient.   Past Surgical History:  Procedure Laterality Date  . COLON SURGERY      Prior to Admission medications   Medication Sig Start Date End Date Taking? Authorizing Provider  DiphenhydrAMINE HCl, Sleep, (UNISOM SLEEPGELS) 50 MG CAPS Take 2 capsules by mouth at bedtime.    [provider]  HYDROcodone-acetaminophen (NORCO/VICODIN) 5-325 MG tablet Take 1 tablet by mouth every 4 (four) hours as needed for moderate pain. 04/05/16   Lavonia Drafts, MD   metFORMIN (GLUCOPHAGE) 500 MG tablet Take 500 mg by mouth 2 (two) times daily with a meal.  10/11/15 10/10/16  [provider]  naproxen (NAPROSYN) 500 MG tablet Take 1 tablet (500 mg total) by mouth 2 (two) times daily with a meal. 04/05/16   Lavonia Drafts, MD  sertraline (ZOLOFT) 50 MG tablet Take 50 mg by mouth daily.    [provider]    Allergies Other  No family history on file.  Social History Social History   Tobacco Use  . Smoking status: Never Smoker  . Smokeless tobacco: Never Used  Substance Use Topics  . Alcohol use: Yes    Alcohol/week: 24.0 standard drinks    Types: 24 Cans of beer per week    Comment: "light beer"  . Drug use: No    Review of Systems Constitutional: No fever/chills Eyes: No visual changes. ENT: No sore throat. No stiff neck no neck pain Cardiovascular: Denies chest pain. Respiratory: Denies shortness of breath. Gastrointestinal:   no vomiting.  No diarrhea.  No constipation. Genitourinary: Negative for dysuria. Musculoskeletal: Negative lower extremity swelling Skin: Negative for rash. Neurological: Negative for severe headaches, focal weakness or numbness.   ____________________________________________   PHYSICAL EXAM:  VITAL SIGNS: ED Triage Vitals  Enc Vitals Group     BP 04/07/18 1331 (!) 185/110     Pulse Rate 04/07/18 1331 84     Resp 04/07/18 1331 20  Temp 04/07/18 1331 98.6 F (37 C)     Temp Source 04/07/18 1331 Oral     SpO2 04/07/18 1331 99 %     Weight 04/07/18 1336 185 lb (83.9 kg)     Height 04/07/18 1336 5\' 8"  (1.727 m)     Head Circumference --      Peak Flow --      Pain Score 04/07/18 1335 2     Pain Loc --      Pain Edu? --      Excl. in [place]? --     Constitutional: Alert and oriented. Well appearing and in no acute distress.  Mildly anxious  eyes: Conjunctivae are normal Head: Atraumatic HEENT: No congestion/rhinnorhea. Mucous membranes are moist.  Oropharynx  non-erythematous Neck:   Nontender with no meningismus, no masses, no stridor Cardiovascular: Normal rate, regular rhythm. Grossly normal heart sounds.  Good peripheral circulation. Respiratory: Normal respiratory effort.  No retractions. Lungs CTAB. Abdominal: Soft and nontender. No distention. No guarding no rebound Back:  There is no focal tenderness or step off.  there is no midline tenderness there are no lesions noted. there is no CVA tenderness Musculoskeletal: No lower extremity tenderness, no upper extremity tenderness. No joint effusions, no DVT signs strong distal pulses no edema Neurologic: Cranial nerves II through XII are grossly intact 5 out of 5 strength bilateral upper and lower extremity. Finger to nose within normal limits heel to shin within normal limits, speech is normal with no word finding difficulty or dysarthria, reflexes symmetric, pupils are equally round and reactive to light, there is no pronator drift, sensation is normal, vision is intact to confrontation, gait is normal, negative Romberg, there is no nystagmus, normal neurologic exam Skin:  Skin is warm, dry and intact. No rash noted. Psychiatric: Mood and affect are normal. Speech and behavior are normal.  ____________________________________________   LABS (all labs ordered are listed, but only abnormal results are displayed)  Labs Reviewed  BASIC METABOLIC PANEL - Abnormal; Notable for the following components:      Result Value   Chloride 97 (*)    Glucose, Bld 166 (*)    All other components within normal limits  URINALYSIS, COMPLETE (UACMP) WITH MICROSCOPIC - Abnormal; Notable for the following components:   Color, Urine YELLOW (*)    APPearance CLEAR (*)    All other components within normal limits  CBC  TROPONIN I  CBG MONITORING, ED    Pertinent labs  results that were available during my care of the patient were reviewed by me and considered in my medical decision making (see chart for  details). ____________________________________________  EKG  I personally interpreted any EKGs ordered by me or triage Sinus rhythm rate 69 bpm no acute ST elevation or depression, partial right bundle branch block no acute ischemia  ____________________________________________  RADIOLOGY  Pertinent labs & imaging results that were available during my care of the patient were reviewed by me and considered in my medical decision making (see chart for details). If possible, patient and/or family made aware of any abnormal findings.  Dg Chest 2 View  Result Date: 04/07/2018 CLINICAL DATA:  Onset of mid chest pain this morning. EXAM: CHEST - 2 VIEW COMPARISON:  PA and lateral chest 06/30/2015. FINDINGS: Lungs clear. Heart size normal. No pneumothorax or pleural effusion. No acute or focal bony abnormality. IMPRESSION: Negative chest. Electronically Signed   By: Inge Rise M.D.   On: 04/07/2018 14:17   ____________________________________________  PROCEDURES  Procedure(s) performed: None  Procedures  Critical Care performed: None  ____________________________________________   INITIAL IMPRESSION / ASSESSMENT AND PLAN / ED COURSE  Pertinent labs & imaging results that were available during my care of the patient were reviewed by me and considered in my medical decision making (see chart for details).  Here with multiple complaints including feeling lightheaded, feeling that his head is still on but is not still on, feeling some chest discomfort, feeling generally unwell.  He denies true vertigo.  He states all of his symptoms happened when he woke up this morning, after drinking beer in a hot garage last night.  Now, his symptoms have resolved and he does not want any further work-up.  I offered him CT scan and MRI for his dizziness sensation and he refused, and from serial cardiac enzymes that he refused, his blood pressure somewhat elevated but is actually the same as it was  when he came here a year ago for his shoulder, this may be his baseline.  Certainly he is awake and alert and understands the risk benefits and alternatives both of having more tests done and of having no further tests done.  Patient understands that in the emergency room I cannot rule out CVA ACS dissection or other significant pathology, even though my suspicion is low, without further testing.  He understands that if he elects to go home this is certainly his choice but it does limit my ability to evaluate him for significant or potentially significant pathology.  I have explained to him that this is what my note will reflect and he is very comfortable with it.  We did have shared decision-making.  I counseled therefore the CT scan that I ordered, and forewent the fluid I was going to give him in for went to repeat troponin.  Patient's blood pressure is noted to be elevated, he states he gets that way when he is anxious.  He does not wish to stay for further evaluation.  Given the patient is alert oriented has no evidence of impaired capacity to make decision, and does not wish further testing obviously our role in this situation is to let him know that he can come back in any time and that he needs to follow-up closely, and I have given him all of this advice.  Patient is requesting paperwork at this time and we will discharge him.  He will take it easy today he states and follow-up with his doctor.  He does understand at any time he can come back    ____________________________________________   FINAL CLINICAL IMPRESSION(S) / ED DIAGNOSES  Final diagnoses:  None      This chart was dictated using voice recognition software.  Despite best efforts to proofread,  errors can occur which can change meaning.      Schuyler Amor, MD 04/07/18 1459    Schuyler Amor, MD 04/07/18 1501

## 2018-11-10 ENCOUNTER — Other Ambulatory Visit: Payer: Self-pay

## 2018-11-10 ENCOUNTER — Emergency Department: Payer: BLUE CROSS/BLUE SHIELD

## 2018-11-10 ENCOUNTER — Emergency Department
Admission: EM | Admit: 2018-11-10 | Discharge: 2018-11-10 | Disposition: A | Discharge status: Home / Self Care | Payer: BLUE CROSS/BLUE SHIELD | Attending: Emergency Medicine | Admitting: Emergency Medicine

## 2018-11-10 ENCOUNTER — Encounter: Payer: Self-pay | Admitting: Emergency Medicine

## 2018-11-10 DIAGNOSIS — Z7984 Long term (current) use of oral hypoglycemic drugs: Secondary | ICD-10-CM | POA: Diagnosis not present

## 2018-11-10 DIAGNOSIS — M5440 Lumbago with sciatica, unspecified side: Secondary | ICD-10-CM | POA: Diagnosis not present

## 2018-11-10 DIAGNOSIS — Z85038 Personal history of other malignant neoplasm of large intestine: Secondary | ICD-10-CM | POA: Diagnosis not present

## 2018-11-10 DIAGNOSIS — E119 Type 2 diabetes mellitus without complications: Secondary | ICD-10-CM | POA: Diagnosis not present

## 2018-11-10 DIAGNOSIS — Z79899 Other long term (current) drug therapy: Secondary | ICD-10-CM | POA: Diagnosis not present

## 2018-11-10 DIAGNOSIS — M545 Low back pain: Secondary | ICD-10-CM | POA: Diagnosis present

## 2018-11-10 DIAGNOSIS — I10 Essential (primary) hypertension: Secondary | ICD-10-CM | POA: Diagnosis not present

## 2018-11-10 HISTORY — DX: Essential (primary) hypertension: I10

## 2018-11-10 MED ORDER — HYDROXYZINE HCL 50 MG PO TABS
50.0000 mg | ORAL_TABLET | Freq: Once | ORAL | Status: AC
  Administered 2018-11-10: 50 mg via ORAL
  Filled 2018-11-10: qty 1

## 2018-11-10 MED ORDER — HYDROMORPHONE HCL 1 MG/ML IJ SOLN
1.0000 mg | Freq: Once | INTRAMUSCULAR | Status: AC
  Administered 2018-11-10: 1 mg via INTRAMUSCULAR
  Filled 2018-11-10: qty 1

## 2018-11-10 MED ORDER — SODIUM CHLORIDE 0.9 % IV BOLUS
1000.0000 mL | Freq: Once | INTRAVENOUS | Status: AC
  Administered 2018-11-10: 1000 mL via INTRAVENOUS

## 2018-11-10 MED ORDER — CYCLOBENZAPRINE HCL 10 MG PO TABS: 10.0000 mg | ORAL_TABLET | Freq: Three times a day (TID) | ORAL | 0 refills | Status: DC | PRN

## 2018-11-10 MED ORDER — HYDROMORPHONE HCL 1 MG/ML IJ SOLN
1.0000 mg | Freq: Once | INTRAMUSCULAR | Status: AC
  Administered 2018-11-10: 1 mg via INTRAVENOUS
  Filled 2018-11-10: qty 1

## 2018-11-10 MED ORDER — DEXAMETHASONE SODIUM PHOSPHATE 10 MG/ML IJ SOLN
10.0000 mg | Freq: Once | INTRAMUSCULAR | Status: AC
  Administered 2018-11-10: 10 mg via INTRAMUSCULAR
  Filled 2018-11-10: qty 1

## 2018-11-10 MED ORDER — OXYCODONE-ACETAMINOPHEN 7.5-325 MG PO TABS: 1.0000 | ORAL_TABLET | Freq: Four times a day (QID) | ORAL | 0 refills | Status: DC | PRN

## 2018-11-10 MED ORDER — KETOROLAC TROMETHAMINE 60 MG/2ML IM SOLN
60.0000 mg | Freq: Once | INTRAMUSCULAR | Status: AC
  Administered 2018-11-10: 60 mg via INTRAMUSCULAR
  Filled 2018-11-10: qty 2

## 2018-11-10 MED ORDER — HYDROMORPHONE HCL 1 MG/ML IJ SOLN: 1.0000 mg | Freq: Once | INTRAMUSCULAR | Status: DC

## 2018-11-10 MED ORDER — ORPHENADRINE CITRATE 30 MG/ML IJ SOLN
60.0000 mg | Freq: Two times a day (BID) | INTRAMUSCULAR | Status: DC
  Administered 2018-11-10: 60 mg via INTRAMUSCULAR
  Filled 2018-11-10: qty 2

## 2018-11-10 MED ORDER — METHYLPREDNISOLONE 4 MG PO TBPK: ORAL_TABLET | ORAL | 0 refills | Status: DC

## 2018-11-10 MED ORDER — HYDROXYZINE HCL 50 MG PO TABS: 50.0000 mg | ORAL_TABLET | Freq: Three times a day (TID) | ORAL | 0 refills | Status: DC | PRN

## 2018-11-10 NOTE — ED Notes (Signed)
Pt requesting more pain meds. Pt resting calmy & comfortably in bed. No signs of distress. PA notified of pt's request.

## 2018-11-10 NOTE — ED Notes (Signed)
States no relief with pain  Provider aware

## 2018-11-10 NOTE — ED Notes (Signed)
Attempted to get pt dressed  Pt is able to sit on side of bed  With min assist..  But when he tried to stand  States his legs were giving out   Provider made aware

## 2018-11-10 NOTE — ED Provider Notes (Signed)
[facility] Emergency Department Provider Note   ____________________________________________   First MD Initiated Contact with Patient 11/10/18 1138     (approximate)  I have reviewed the triage vital signs and the nursing notes.   HISTORY  Chief Complaint Back Pain    HPI Keith Dorsey is a 53 y.o. male patient arrived via EMS complaining of low back pain.  Onset of complaint was last night when the patient went to void.  Patient state he is unable to stand up or ambulate.  Patient denies radicular component to his pain.  Patient denies bladder or bowel dysfunction.  Patient stated similar episode 3 months ago.  Patient  complaint was resolved by chiropractor.  Patient rates pain as a 3/10 which increases to a 10+ when trying to sit up or ambulate.  Patient took Robaxin and Tylenol approximately 2 hours ago.  Patient state mild relief.     Past Medical History:  Diagnosis Date  . Cancer [facility]) 2001   Colon  . Diabetes mellitus without complication ([place])   . Hypertension     There are no active problems to display for this patient.   Past Surgical History:  Procedure Laterality Date  . COLON SURGERY      Prior to Admission medications   Medication Sig Start Date End Date Taking? Authorizing Provider  citalopram (CELEXA) 10 MG tablet Take 10 mg by mouth daily.   Yes [provider]  lisinopril (PRINIVIL,ZESTRIL) 10 MG tablet Take 10 mg by mouth daily.   Yes [provider]  cyclobenzaprine (FLEXERIL) 10 MG tablet Take 1 tablet (10 mg total) by mouth 3 (three) times daily as needed. 11/10/18   Sable Feil, PA-C  DiphenhydrAMINE HCl, Sleep, (UNISOM SLEEPGELS) 50 MG CAPS Take 2 capsules by mouth at bedtime.    [provider]  hydrOXYzine (ATARAX/VISTARIL) 50 MG tablet Take 1 tablet (50 mg total) by mouth 3 (three) times daily as needed for itching. 11/10/18   Sable Feil, PA-C  metFORMIN (GLUCOPHAGE) 500 MG  tablet Take 500 mg by mouth 2 (two) times daily with a meal.  10/11/15 10/10/16  [provider]  methylPREDNISolone (MEDROL DOSEPAK) 4 MG TBPK tablet Take Tapered dose as directed 11/10/18   Sable Feil, PA-C  oxyCODONE-acetaminophen (PERCOCET) 7.5-325 MG tablet Take 1 tablet by mouth every 6 (six) hours as needed. 11/10/18   Sable Feil, PA-C    Allergies Other  No family history on file.  Social History Social History   Tobacco Use  . Smoking status: Never Smoker  . Smokeless tobacco: Never Used  Substance Use Topics  . Alcohol use: Yes    Alcohol/week: 24.0 standard drinks    Types: 24 Cans of beer per week    Comment: "light beer"  . Drug use: No    Review of Systems Constitutional: No fever/chills Eyes: No visual changes. ENT: No sore throat. Cardiovascular: Denies chest pain. Respiratory: Denies shortness of breath. Gastrointestinal: No abdominal pain.  No nausea, no vomiting.  No diarrhea.  No constipation. Genitourinary: Negative for dysuria. Musculoskeletal: Negative for back pain. Skin: Negative for rash. Neurological: Negative for headaches, focal weakness or numbness. Endocrine:  Diabetes and hypertension. Allergic/Immunilogical: Patient states itching with narcotic pain medications. ____________________________________________   PHYSICAL EXAM:  VITAL SIGNS: ED Triage Vitals  Enc Vitals Group     BP 11/10/18 1137 (!) 181/103     Pulse Rate 11/10/18 1137 64     Resp 11/10/18  1137 18     Temp 11/10/18 1137 (!) 97.4 F (36.3 C)     Temp Source 11/10/18 1137 Oral     SpO2 11/10/18 1137 98 %     Weight 11/10/18 1137 175 lb (79.4 kg)     Height 11/10/18 1137 5\' 8"  (1.727 m)     Head Circumference --      Peak Flow --      Pain Score 11/10/18 1139 3     Pain Loc --      Pain Edu? --      Excl. in [place]? --    Constitutional: Alert and oriented.  Mild distress.   Neck: No stridor.   Hematological/Lymphatic/Immunilogical: No cervical  lymphadenopathy. Cardiovascular: Normal rate, regular rhythm. Grossly normal heart sounds.  Good peripheral circulation.  Elevated blood pressure. Respiratory: Normal respiratory effort.  No retractions. Lungs CTAB. Gastrointestinal: Soft and nontender. No distention. No abdominal bruits. No CVA tenderness. Musculoskeletal: No obvious spinal deformity.  Patient refused to sit or stand for further exam of the lumbar spine.  Patient is able to move lower extremities in the supine position without discomfort.   Neurologic:  Normal speech and language. No gross focal neurologic deficits are appreciated. No gait instability. Skin:  Skin is warm, dry and intact. No rash noted. Psychiatric: Mood and affect are normal. Speech and behavior are normal.  ____________________________________________   LABS (all labs ordered are listed, but only abnormal results are displayed)  Labs Reviewed - No data to display ____________________________________________  EKG   ____________________________________________  RADIOLOGY  ED MD interpretation:    Official radiology report(s): Dg Lumbar Spine 2-3 Views  Result Date: 11/10/2018 CLINICAL DATA:  Acute low back pain since last night. No known injury. EXAM: LUMBAR SPINE - 2-3 VIEW COMPARISON:  CT abdomen pelvis dated June 30, 2015. FINDINGS: Five lumbar type vertebral bodies. No acute fracture or subluxation. Vertebral body heights are preserved. New 3 mm anterolisthesis at L3-L4. Unchanged chronic bilateral L3 pars defects. Unchanged moderate disc height loss at L3-L4. Remaining intervertebral disc spaces are maintained. IMPRESSION: 1. Unchanged moderate degenerative disc disease at L3-L4. 2. Grade 1 anterolisthesis at L3-L4, new since 2016, with unchanged chronic L3 pars defects. Electronically Signed   By: Titus Dubin M.D.   On: 11/10/2018 12:30   Ct Lumbar Spine Wo Contrast  Result Date: 11/10/2018 CLINICAL DATA:  Radiating pain. Was seen by  chiropractor in the past with relief. EXAM: CT LUMBAR SPINE WITHOUT CONTRAST TECHNIQUE: Multidetector CT imaging of the lumbar spine was performed without intravenous contrast administration. Multiplanar CT image reconstructions were also generated. COMPARISON:  None. FINDINGS: Segmentation: 5 lumbar type vertebrae. Alignment: 2 mm anterolisthesis of L3 on L4 secondary bilateral L3 pars interarticularis defects. Vertebrae: No acute fracture or focal pathologic process. Paraspinal and other soft tissues: No acute paraspinal abnormality. Disc levels: Degenerative disc disease with disc height loss at L3-4. T12-L1: No disc protrusion. Mild bilateral facet arthropathy. No foraminal stenosis. L1-L2: No disc protrusion. Mild bilateral facet arthropathy. No foraminal stenosis. L2-L3: No disc protrusion. Mild bilateral facet arthropathy. No foraminal stenosis. L3-L4: Broad-based disc bulge. Mild bilateral facet arthropathy. No foraminal stenosis. L4-L5: Mild broad-based disc bulge. Mild bilateral facet arthropathy. No foraminal stenosis. L5-S1: Mild broad-based disc bulge. Mild bilateral facet arthropathy. No foraminal stenosis. IMPRESSION: 1. At L3-4 there is a broad-based disc bulge. Mild bilateral facet arthropathy. Electronically Signed   By: Kathreen Devoid   On: 11/10/2018 13:56    ____________________________________________   PROCEDURES  Procedure(s) performed (including Critical Care):  Procedures   ____________________________________________   INITIAL IMPRESSION / ASSESSMENT AND PLAN / ED COURSE  As part of my medical decision making, I reviewed the following data within the electronic MEDICAL RECORD NUMBER         Acute low back pain.  Discussed x-ray findings showing a moderate disc bulge at L3 and 4.  Discussed sequela of back pain with patient.  Advised to follow orthopedic for definitive evaluation and treatment.  Advised to take Atarax for itching when taking narcotic pain medication.       ____________________________________________   FINAL CLINICAL IMPRESSION(S) / ED DIAGNOSES  Final diagnoses:  Acute right-sided low back pain with sciatica, sciatica laterality unspecified     ED Discharge Orders         Ordered    hydrOXYzine (ATARAX/VISTARIL) 50 MG tablet  3 times daily PRN     11/10/18 1435    oxyCODONE-acetaminophen (PERCOCET) 7.5-325 MG tablet  Every 6 hours PRN     11/10/18 1435    cyclobenzaprine (FLEXERIL) 10 MG tablet  3 times daily PRN     11/10/18 1435    methylPREDNISolone (MEDROL DOSEPAK) 4 MG TBPK tablet     11/10/18 1435           Note:  This document was prepared using Dragon voice recognition software and may include unintentional dictation errors.    Sable Feil, PA-C 11/10/18 1440    Delman Kitten, MD 11/10/18 2218

## 2018-11-10 NOTE — ED Notes (Signed)
Additional pain meds given   Pt is able to turn on to side and is able to lay with legs crossed

## 2018-11-10 NOTE — ED Notes (Signed)
Pt leaving for MRI.  

## 2018-11-10 NOTE — Discharge Instructions (Signed)
Advised to follow orthopedic clinic for definitive evaluation and treatment.  Remember to take Atarax for itching when taking narcotic pain medication.

## 2018-11-10 NOTE — ED Triage Notes (Signed)
Presents via EMS from home  Was found on floor by g/f    States he thinks he has a pinched nerve in his back but was unable to get up or walk  States pain is non radiating    Hx of same in past was seen by chiropractor in past and had relief  Denies any new injury did take robaxin 500 mg and tylenol around 9 am

## 2019-04-20 ENCOUNTER — Other Ambulatory Visit: Payer: Self-pay

## 2019-04-20 DIAGNOSIS — Z20822 Contact with and (suspected) exposure to covid-19: Secondary | ICD-10-CM

## 2019-04-22 LAB — NOVEL CORONAVIRUS, NAA: SARS-CoV-2, NAA: NOT DETECTED

## 2019-09-22 ENCOUNTER — Other Ambulatory Visit: Payer: Self-pay | Admitting: Pediatrics

## 2019-09-22 DIAGNOSIS — R7989 Other specified abnormal findings of blood chemistry: Secondary | ICD-10-CM

## 2019-09-29 ENCOUNTER — Ambulatory Visit
Admission: RE | Admit: 2019-09-29 | Discharge: 2019-09-29 | Disposition: A | Discharge status: Home / Self Care | Payer: BC Managed Care – PPO | Source: Ambulatory Visit | Attending: Pediatrics | Admitting: Pediatrics

## 2019-09-29 ENCOUNTER — Other Ambulatory Visit: Payer: Self-pay

## 2019-09-29 DIAGNOSIS — R7989 Other specified abnormal findings of blood chemistry: Secondary | ICD-10-CM | POA: Insufficient documentation

## 2019-10-01 DIAGNOSIS — M51369 Other intervertebral disc degeneration, lumbar region without mention of lumbar back pain or lower extremity pain: Secondary | ICD-10-CM | POA: Insufficient documentation

## 2020-02-01 ENCOUNTER — Emergency Department
Admission: EM | Admit: 2020-02-01 | Discharge: 2020-02-01 | Disposition: A | Discharge status: Home / Self Care | Payer: BC Managed Care – PPO | Attending: Emergency Medicine | Admitting: Emergency Medicine

## 2020-02-01 ENCOUNTER — Other Ambulatory Visit: Payer: Self-pay

## 2020-02-01 ENCOUNTER — Emergency Department: Payer: BC Managed Care – PPO

## 2020-02-01 DIAGNOSIS — Y929 Unspecified place or not applicable: Secondary | ICD-10-CM | POA: Insufficient documentation

## 2020-02-01 DIAGNOSIS — Z7984 Long term (current) use of oral hypoglycemic drugs: Secondary | ICD-10-CM | POA: Diagnosis not present

## 2020-02-01 DIAGNOSIS — Z85038 Personal history of other malignant neoplasm of large intestine: Secondary | ICD-10-CM | POA: Diagnosis not present

## 2020-02-01 DIAGNOSIS — I10 Essential (primary) hypertension: Secondary | ICD-10-CM | POA: Insufficient documentation

## 2020-02-01 DIAGNOSIS — Y999 Unspecified external cause status: Secondary | ICD-10-CM | POA: Diagnosis not present

## 2020-02-01 DIAGNOSIS — Y939 Activity, unspecified: Secondary | ICD-10-CM | POA: Diagnosis not present

## 2020-02-01 DIAGNOSIS — E119 Type 2 diabetes mellitus without complications: Secondary | ICD-10-CM | POA: Insufficient documentation

## 2020-02-01 DIAGNOSIS — S20211A Contusion of right front wall of thorax, initial encounter: Secondary | ICD-10-CM

## 2020-02-01 DIAGNOSIS — R0789 Other chest pain: Secondary | ICD-10-CM | POA: Diagnosis present

## 2020-02-01 MED ORDER — ORPHENADRINE CITRATE 30 MG/ML IJ SOLN
60.0000 mg | Freq: Two times a day (BID) | INTRAMUSCULAR | Status: DC
  Administered 2020-02-01: 60 mg via INTRAMUSCULAR
  Filled 2020-02-01: qty 2

## 2020-02-01 MED ORDER — KETOROLAC TROMETHAMINE 30 MG/ML IJ SOLN
30.0000 mg | Freq: Once | INTRAMUSCULAR | Status: AC
  Administered 2020-02-01: 30 mg via INTRAMUSCULAR
  Filled 2020-02-01: qty 1

## 2020-02-01 MED ORDER — HYDROMORPHONE HCL 1 MG/ML IJ SOLN
1.0000 mg | Freq: Once | INTRAMUSCULAR | Status: AC
  Administered 2020-02-01: 1 mg via INTRAMUSCULAR
  Filled 2020-02-01: qty 1

## 2020-02-01 MED ORDER — KETOROLAC TROMETHAMINE 10 MG PO TABS: 10.0000 mg | ORAL_TABLET | Freq: Four times a day (QID) | ORAL | 0 refills | Status: DC | PRN

## 2020-02-01 MED ORDER — OXYCODONE-ACETAMINOPHEN 7.5-325 MG PO TABS: 1.0000 | ORAL_TABLET | Freq: Four times a day (QID) | ORAL | 0 refills | Status: DC | PRN

## 2020-02-01 MED ORDER — LIDOCAINE 5 % EX PTCH
1.0000 | MEDICATED_PATCH | CUTANEOUS | Status: DC
  Administered 2020-02-01: 1 via TRANSDERMAL
  Filled 2020-02-01: qty 1

## 2020-02-01 MED ORDER — HYDROXYZINE HCL 50 MG PO TABS: 50.0000 mg | ORAL_TABLET | Freq: Three times a day (TID) | ORAL | 0 refills | Status: DC | PRN

## 2020-02-01 MED ORDER — CYCLOBENZAPRINE HCL 10 MG PO TABS: 10.0000 mg | ORAL_TABLET | Freq: Three times a day (TID) | ORAL | 0 refills | Status: DC | PRN
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
## 2020-02-01 NOTE — Discharge Instructions (Signed)
Follow discharge care instruction take medication as directed.  If no improvement in 2 to 3 days follow-up PCP or return back to ED.
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
## 2020-02-03 ENCOUNTER — Encounter: Payer: Self-pay | Admitting: Emergency Medicine

## 2020-02-03 ENCOUNTER — Emergency Department: Payer: BC Managed Care – PPO

## 2020-02-03 ENCOUNTER — Emergency Department
Admission: EM | Admit: 2020-02-03 | Discharge: 2020-02-03 | Disposition: A | Discharge status: Home / Self Care | Payer: BC Managed Care – PPO | Attending: Student | Admitting: Student

## 2020-02-03 ENCOUNTER — Other Ambulatory Visit: Payer: Self-pay

## 2020-02-03 DIAGNOSIS — Y998 Other external cause status: Secondary | ICD-10-CM | POA: Diagnosis not present

## 2020-02-03 DIAGNOSIS — Y9389 Activity, other specified: Secondary | ICD-10-CM | POA: Insufficient documentation

## 2020-02-03 DIAGNOSIS — S2231XA Fracture of one rib, right side, initial encounter for closed fracture: Secondary | ICD-10-CM | POA: Insufficient documentation

## 2020-02-03 DIAGNOSIS — I10 Essential (primary) hypertension: Secondary | ICD-10-CM | POA: Insufficient documentation

## 2020-02-03 DIAGNOSIS — Y92410 Unspecified street and highway as the place of occurrence of the external cause: Secondary | ICD-10-CM | POA: Diagnosis not present

## 2020-02-03 DIAGNOSIS — Z7984 Long term (current) use of oral hypoglycemic drugs: Secondary | ICD-10-CM | POA: Insufficient documentation

## 2020-02-03 DIAGNOSIS — E119 Type 2 diabetes mellitus without complications: Secondary | ICD-10-CM | POA: Diagnosis not present

## 2020-02-03 DIAGNOSIS — S2239XA Fracture of one rib, unspecified side, initial encounter for closed fracture: Secondary | ICD-10-CM

## 2020-02-03 DIAGNOSIS — S299XXA Unspecified injury of thorax, initial encounter: Secondary | ICD-10-CM | POA: Diagnosis present
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
## 2020-02-03 NOTE — Discharge Instructions (Signed)
Follow-up with your primary care provider if any continued problems.  You may use ice to your ribs as needed for discomfort and if any swelling.  Take deep breaths once or twice an hour to prevent pneumonia.  Also placing a pillow over your ribs prior to coughing or sneezing will decrease the amount of pain and give your ribs some support.  Also if possible try to sleep on your right side so that your ribs have the support of the mattress which should also reduce rib pain.
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
## 2020-02-08 ENCOUNTER — Other Ambulatory Visit: Payer: Self-pay | Admitting: Physician Assistant
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
## 2021-01-31 IMAGING — CR LUMBAR SPINE - 2-3 VIEW
1 series · 3 of 3 positions shown · non-contrast
Comparison: CT abdomen pelvis dated June 30, 2015.

CLINICAL DATA: Acute low back pain since last night. No known
injury.

EXAM:
LUMBAR SPINE - 2-3 VIEW

[Series 1: t lumbar spine ap · 0.14mm/px · 3 of 3 slices shown]
[im 1/3]
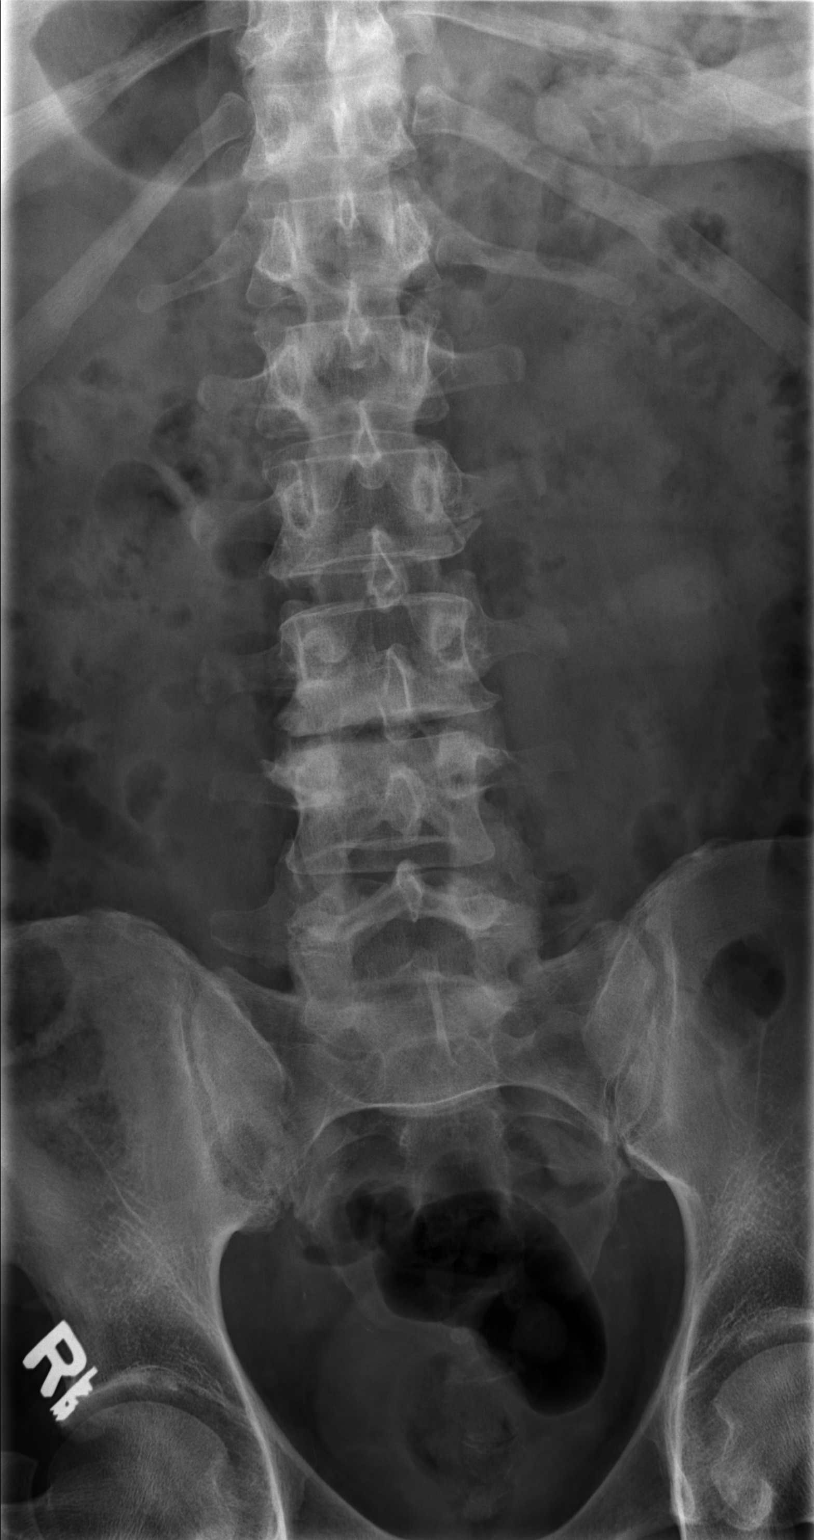
[im 2/3]
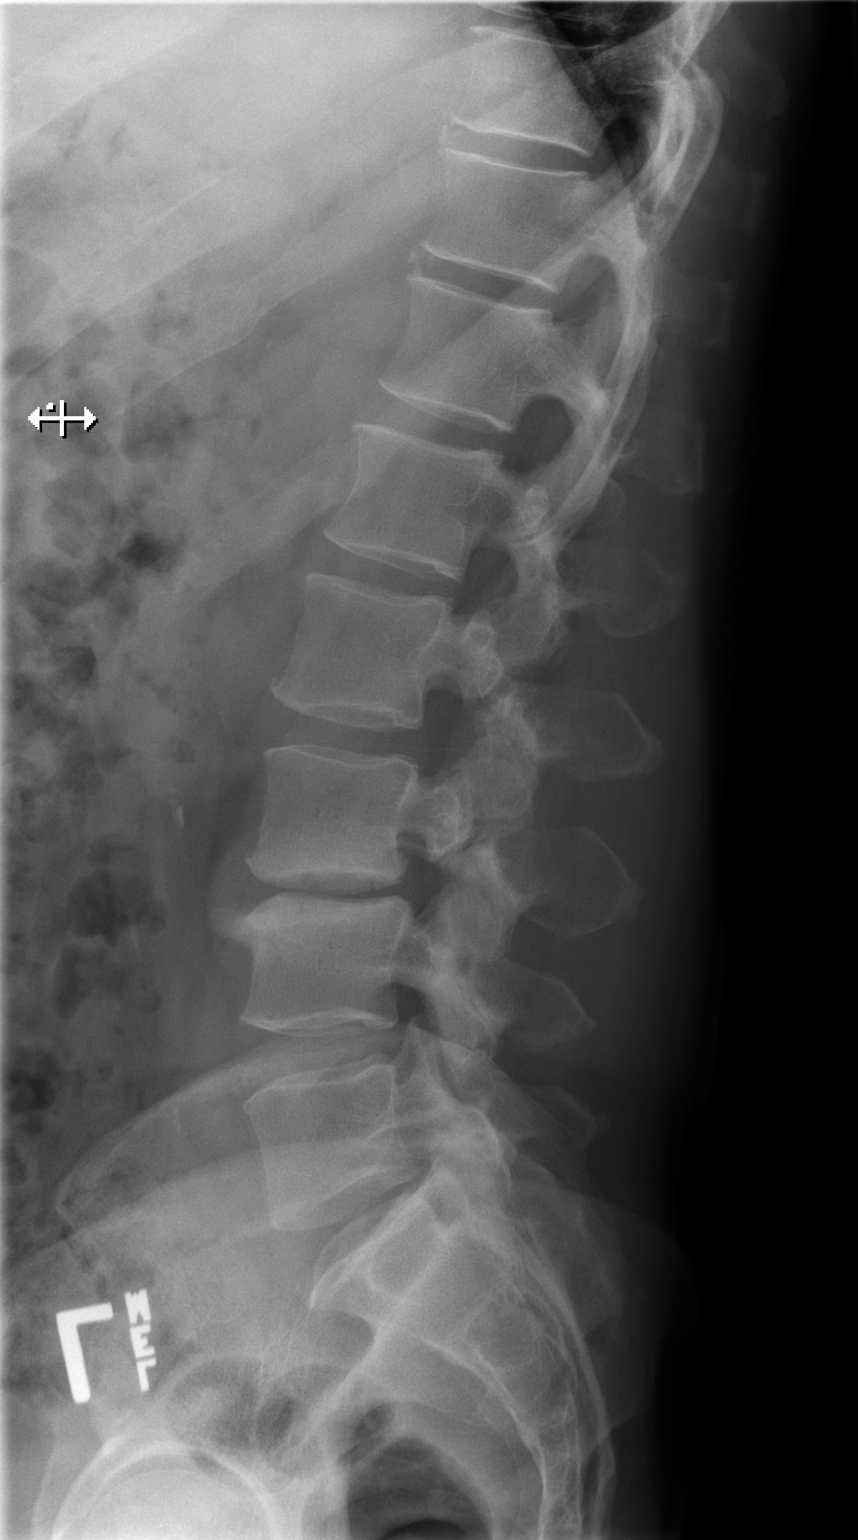
[im 3/3]
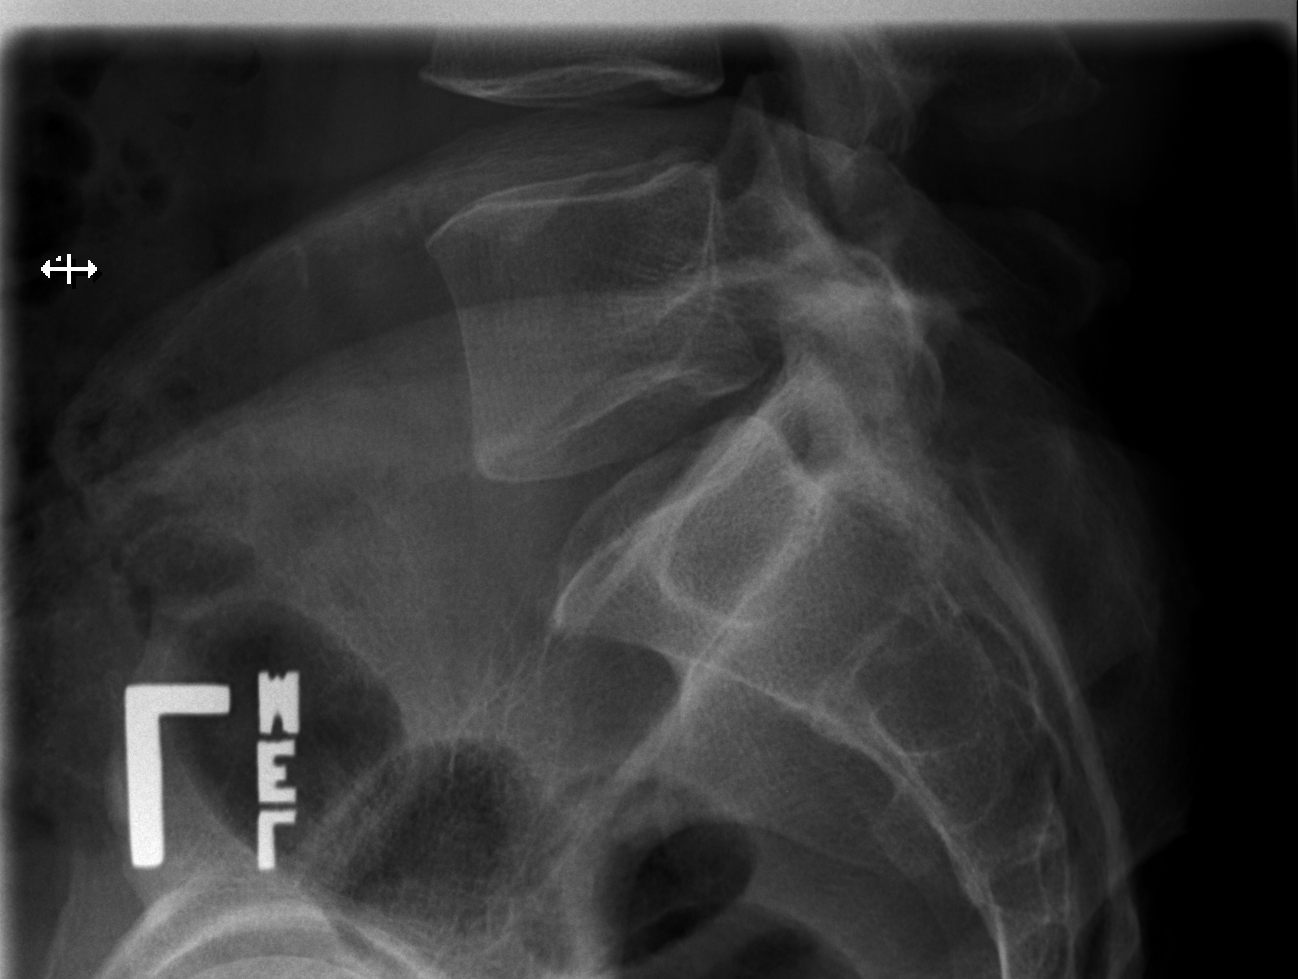

[3 of 3 positions shown; findings below may reference images not displayed]

FINDINGS: Five lumbar type vertebral bodies. No acute fracture or subluxation.
Vertebral body heights are preserved. New 3 mm anterolisthesis at
L3-L4. Unchanged chronic bilateral L3 pars defects. Unchanged
moderate disc height loss at L3-L4. Remaining intervertebral disc
spaces are maintained.
IMPRESSION: 1. Unchanged moderate degenerative disc disease at L3-L4.
2. Grade 1 anterolisthesis at L3-L4, new since 0620, with unchanged
chronic L3 pars defects.

## 2021-12-20 IMAGING — US US ABDOMEN LIMITED
1 series · 14 of 25 positions shown · non-contrast
Comparison: None.

CLINICAL DATA: Elevated LFTs

EXAM:
ULTRASOUND ABDOMEN LIMITED RIGHT UPPER QUADRANT

[Series 1: us abdomen limited · 0.26mm/px · 14 of 58 slices shown]
[im 1/58]
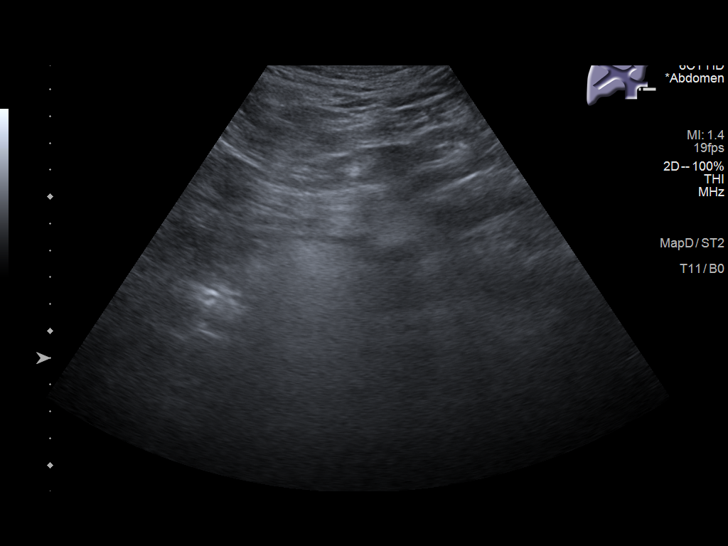
[im 5/58]
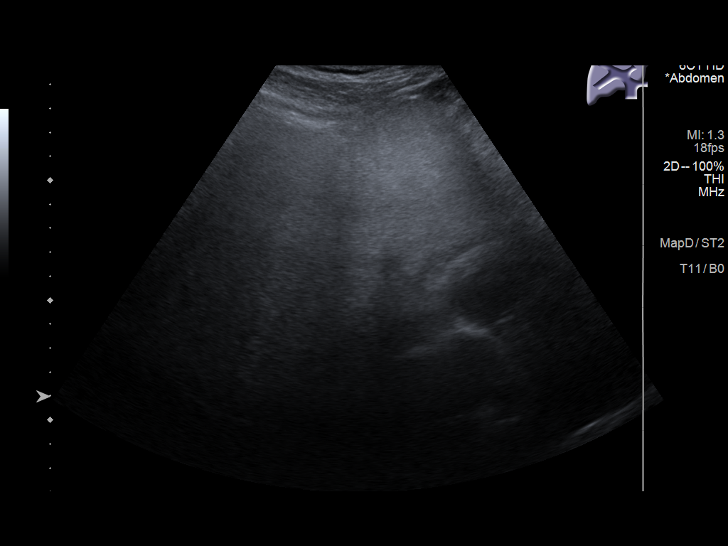
[im 10/58]
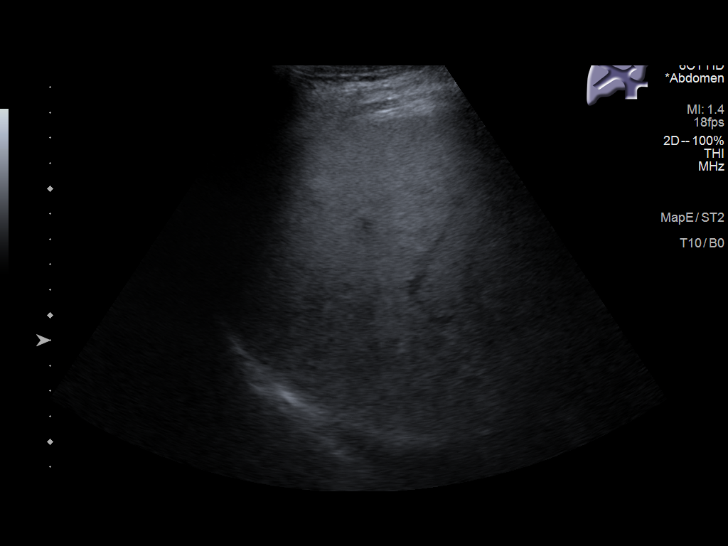
[im 15/58]
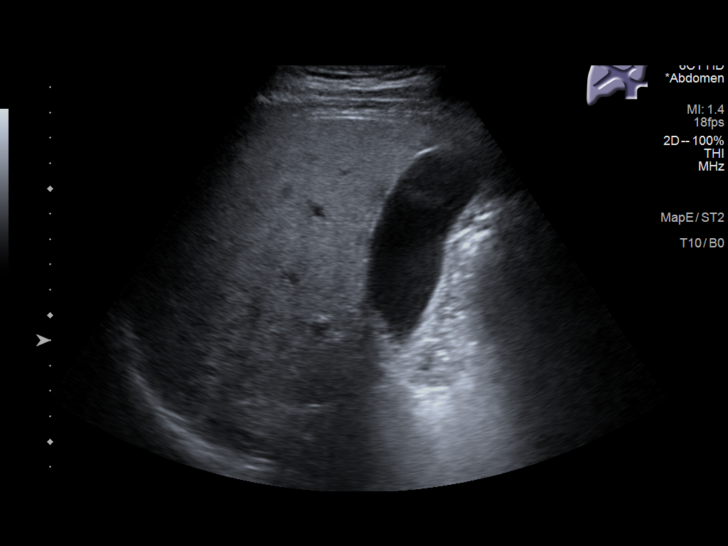
[im 20/58]
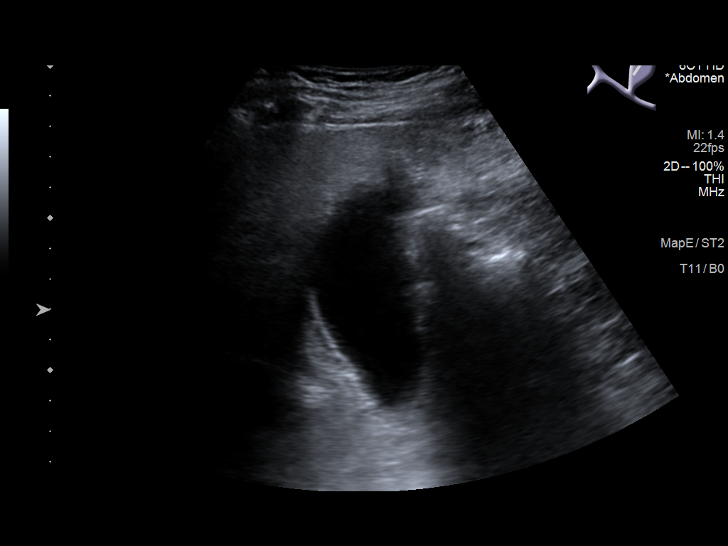
[im 22/58]
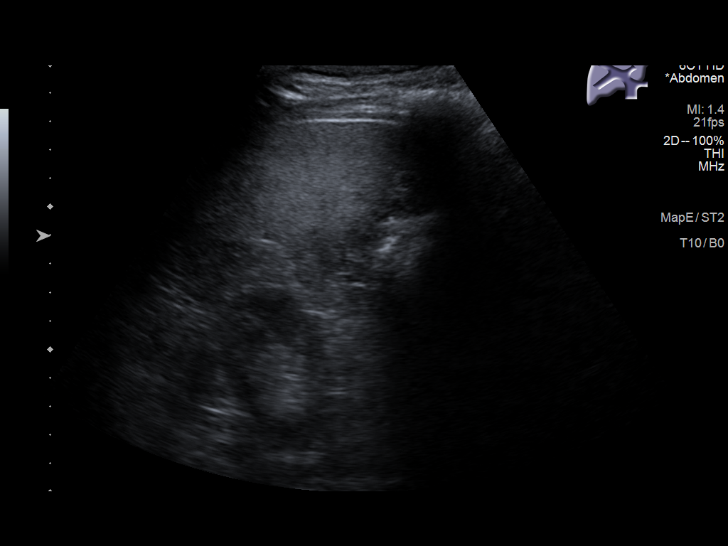
[im 27/58]
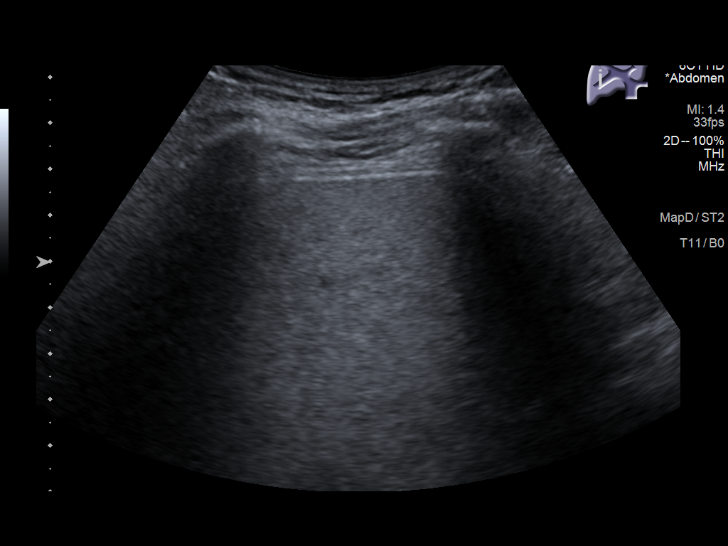
[im 31/58]
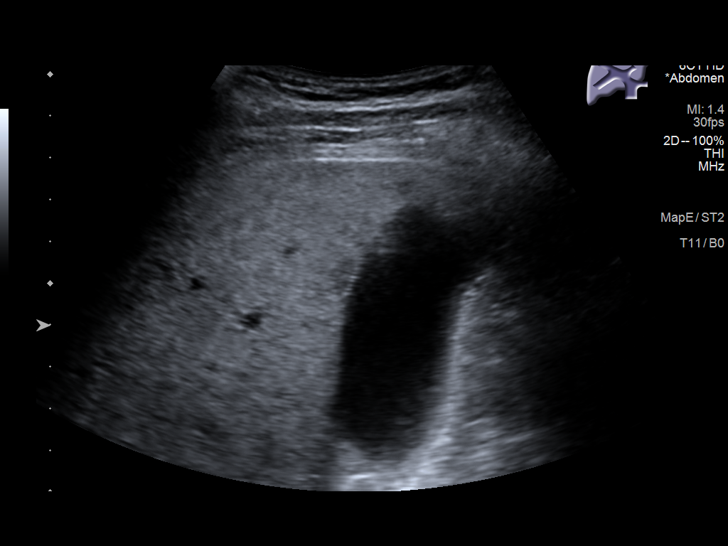
[im 36/58]
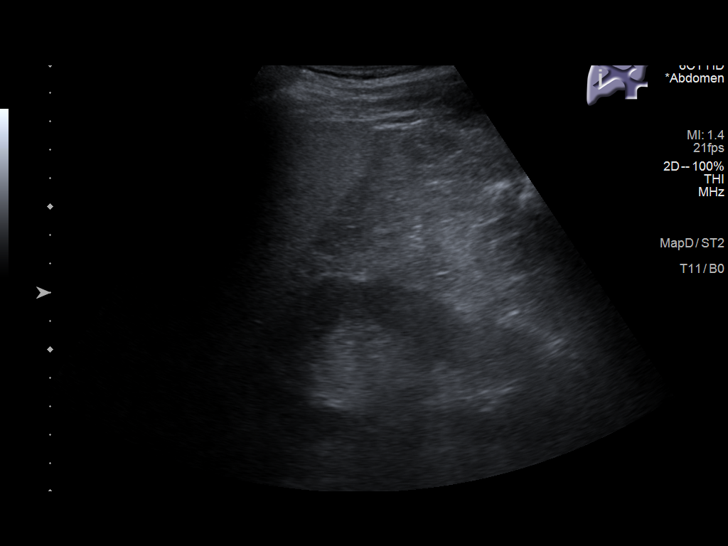
[im 39/58]
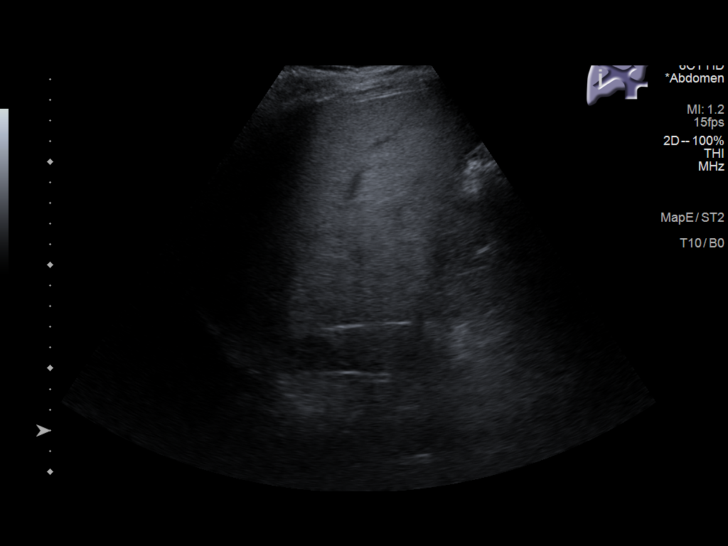
[im 43/58]
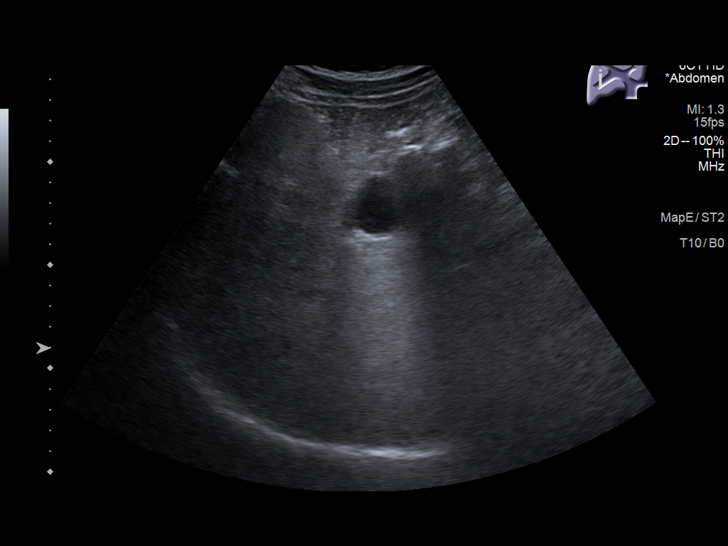
[im 48/58]
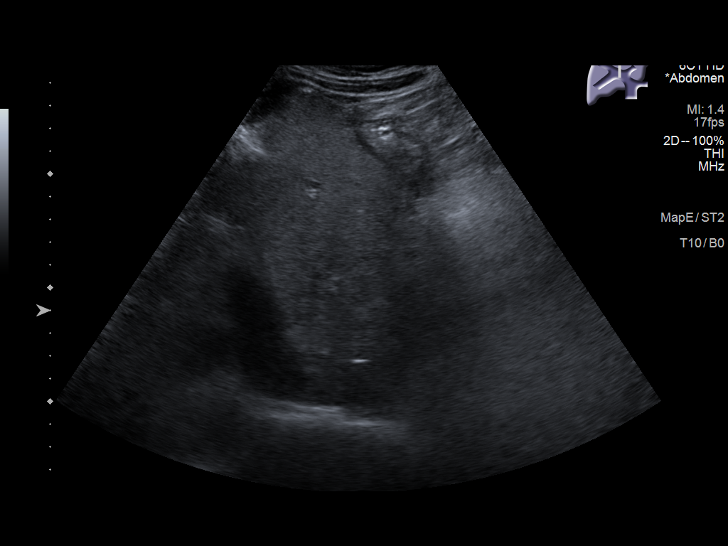
[im 53/58]
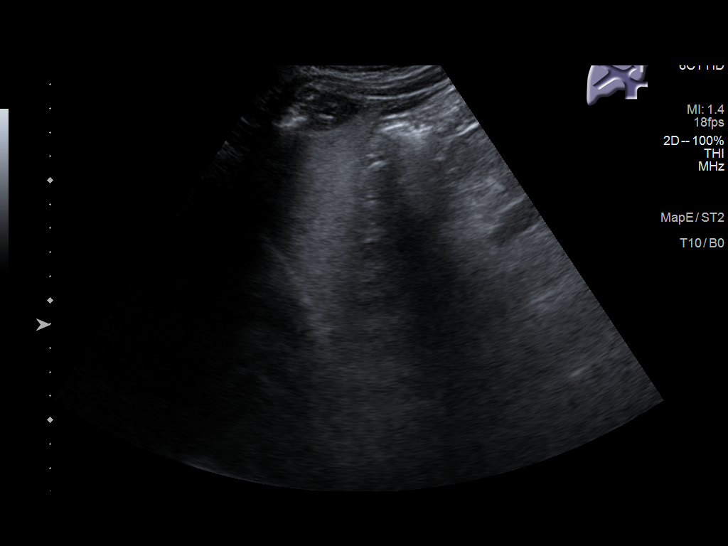
[im 58/58]
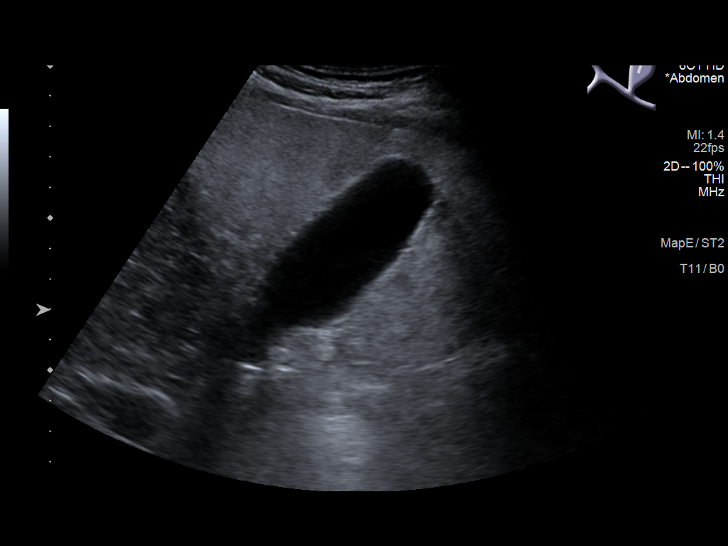

[14 of 25 positions shown; findings below may reference images not displayed]

FINDINGS: Gallbladder:

No gallstones or wall thickening visualized. No sonographic Murphy
sign noted by sonographer.

Common bile duct:

Diameter: 3.5 mm

Liver:

Diffuse increased echogenicity is noted likely related to fatty
infiltration. No focal mass is seen. Portal vein is patent on color
Doppler imaging with normal direction of blood flow towards the
liver.

Other: None.
IMPRESSION: Fatty liver.
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
## 2022-06-07 DIAGNOSIS — N529 Male erectile dysfunction, unspecified: Secondary | ICD-10-CM | POA: Insufficient documentation

## 2022-12-24 DIAGNOSIS — M25551 Pain in right hip: Secondary | ICD-10-CM | POA: Diagnosis not present

## 2022-12-24 DIAGNOSIS — M5416 Radiculopathy, lumbar region: Secondary | ICD-10-CM | POA: Diagnosis not present

## 2022-12-26 DIAGNOSIS — R079 Chest pain, unspecified: Secondary | ICD-10-CM | POA: Diagnosis not present

## 2022-12-26 DIAGNOSIS — F5101 Primary insomnia: Secondary | ICD-10-CM | POA: Diagnosis not present

## 2022-12-26 DIAGNOSIS — E782 Mixed hyperlipidemia: Secondary | ICD-10-CM | POA: Diagnosis not present

## 2022-12-26 DIAGNOSIS — E119 Type 2 diabetes mellitus without complications: Secondary | ICD-10-CM | POA: Diagnosis not present

## 2022-12-26 DIAGNOSIS — F331 Major depressive disorder, recurrent, moderate: Secondary | ICD-10-CM | POA: Diagnosis not present

## 2022-12-26 DIAGNOSIS — F419 Anxiety disorder, unspecified: Secondary | ICD-10-CM | POA: Diagnosis not present

## 2022-12-26 DIAGNOSIS — N529 Male erectile dysfunction, unspecified: Secondary | ICD-10-CM | POA: Diagnosis not present

## 2022-12-26 DIAGNOSIS — I10 Essential (primary) hypertension: Secondary | ICD-10-CM | POA: Diagnosis not present

## 2023-01-07 DIAGNOSIS — I10 Essential (primary) hypertension: Secondary | ICD-10-CM | POA: Diagnosis not present

## 2023-01-07 DIAGNOSIS — E119 Type 2 diabetes mellitus without complications: Secondary | ICD-10-CM | POA: Diagnosis not present

## 2023-01-23 DIAGNOSIS — M5416 Radiculopathy, lumbar region: Secondary | ICD-10-CM | POA: Diagnosis not present

## 2023-02-22 DIAGNOSIS — M5416 Radiculopathy, lumbar region: Secondary | ICD-10-CM | POA: Diagnosis not present

## 2023-04-08 DIAGNOSIS — E782 Mixed hyperlipidemia: Secondary | ICD-10-CM | POA: Diagnosis not present

## 2023-04-08 DIAGNOSIS — R079 Chest pain, unspecified: Secondary | ICD-10-CM | POA: Diagnosis not present

## 2023-04-08 DIAGNOSIS — F5101 Primary insomnia: Secondary | ICD-10-CM | POA: Diagnosis not present

## 2023-04-08 DIAGNOSIS — I10 Essential (primary) hypertension: Secondary | ICD-10-CM | POA: Diagnosis not present

## 2023-04-08 DIAGNOSIS — M5136 Other intervertebral disc degeneration, lumbar region: Secondary | ICD-10-CM | POA: Diagnosis not present

## 2023-04-08 DIAGNOSIS — Z0000 Encounter for general adult medical examination without abnormal findings: Secondary | ICD-10-CM | POA: Diagnosis not present

## 2023-04-08 DIAGNOSIS — M79604 Pain in right leg: Secondary | ICD-10-CM | POA: Diagnosis not present

## 2023-04-08 DIAGNOSIS — E119 Type 2 diabetes mellitus without complications: Secondary | ICD-10-CM | POA: Diagnosis not present

## 2023-04-08 DIAGNOSIS — F331 Major depressive disorder, recurrent, moderate: Secondary | ICD-10-CM | POA: Diagnosis not present

## 2023-04-08 DIAGNOSIS — F419 Anxiety disorder, unspecified: Secondary | ICD-10-CM | POA: Diagnosis not present

## 2023-04-08 DIAGNOSIS — F1010 Alcohol abuse, uncomplicated: Secondary | ICD-10-CM | POA: Diagnosis not present

## 2023-04-30 DIAGNOSIS — E782 Mixed hyperlipidemia: Secondary | ICD-10-CM | POA: Diagnosis not present

## 2023-04-30 DIAGNOSIS — R0789 Other chest pain: Secondary | ICD-10-CM | POA: Diagnosis not present

## 2023-04-30 DIAGNOSIS — E119 Type 2 diabetes mellitus without complications: Secondary | ICD-10-CM | POA: Diagnosis not present

## 2023-04-30 DIAGNOSIS — I10 Essential (primary) hypertension: Secondary | ICD-10-CM | POA: Diagnosis not present

## 2023-05-03 DIAGNOSIS — M542 Cervicalgia: Secondary | ICD-10-CM | POA: Diagnosis not present

## 2023-05-03 DIAGNOSIS — M5416 Radiculopathy, lumbar region: Secondary | ICD-10-CM | POA: Diagnosis not present

## 2023-05-13 DIAGNOSIS — R0789 Other chest pain: Secondary | ICD-10-CM | POA: Diagnosis not present

## 2023-05-15 DIAGNOSIS — E782 Mixed hyperlipidemia: Secondary | ICD-10-CM | POA: Diagnosis not present

## 2023-05-15 DIAGNOSIS — E119 Type 2 diabetes mellitus without complications: Secondary | ICD-10-CM | POA: Diagnosis not present

## 2023-05-15 DIAGNOSIS — I10 Essential (primary) hypertension: Secondary | ICD-10-CM | POA: Diagnosis not present

## 2023-05-15 DIAGNOSIS — R0609 Other forms of dyspnea: Secondary | ICD-10-CM | POA: Diagnosis not present

## 2023-05-15 DIAGNOSIS — R079 Chest pain, unspecified: Secondary | ICD-10-CM | POA: Diagnosis not present

## 2023-05-15 DIAGNOSIS — R9439 Abnormal result of other cardiovascular function study: Secondary | ICD-10-CM | POA: Diagnosis not present

## 2023-05-21 ENCOUNTER — Encounter
Admission: RE | Disposition: A | Discharge status: Home / Self Care | Payer: Self-pay | Source: Ambulatory Visit | Attending: Internal Medicine

## 2023-05-21 ENCOUNTER — Ambulatory Visit
Admission: RE | Admit: 2023-05-21 | Discharge: 2023-05-21 | Disposition: A | Discharge status: Home / Self Care | Payer: 59 | Source: Ambulatory Visit | Attending: Internal Medicine | Admitting: Internal Medicine

## 2023-05-21 ENCOUNTER — Encounter: Payer: Self-pay | Admitting: Internal Medicine

## 2023-05-21 ENCOUNTER — Other Ambulatory Visit: Payer: Self-pay

## 2023-05-21 DIAGNOSIS — I2584 Coronary atherosclerosis due to calcified coronary lesion: Secondary | ICD-10-CM | POA: Insufficient documentation

## 2023-05-21 DIAGNOSIS — I2510 Atherosclerotic heart disease of native coronary artery without angina pectoris: Secondary | ICD-10-CM | POA: Insufficient documentation

## 2023-05-21 DIAGNOSIS — R9430 Abnormal result of cardiovascular function study, unspecified: Secondary | ICD-10-CM | POA: Diagnosis not present

## 2023-05-21 HISTORY — PX: LEFT HEART CATH AND CORONARY ANGIOGRAPHY: CATH118249

## 2023-05-21 LAB — GLUCOSE, CAPILLARY
Glucose-Capillary: 253 mg/dL — ABNORMAL HIGH (ref 70–99)
Glucose-Capillary: 245 mg/dL — ABNORMAL HIGH (ref 70–99)

## 2023-05-21 SURGERY — LEFT HEART CATH AND CORONARY ANGIOGRAPHY
Anesthesia: Moderate Sedation | Laterality: Left

## 2023-05-21 MED ORDER — SODIUM CHLORIDE 0.9 % IV SOLN: 250.0000 mL | INTRAVENOUS | Status: DC | PRN

## 2023-05-21 MED ORDER — VERAPAMIL HCL 2.5 MG/ML IV SOLN
INTRAVENOUS | Status: DC | PRN
  Administered 2023-05-21: 2.5 mg via INTRA_ARTERIAL

## 2023-05-21 MED ORDER — HYDRALAZINE HCL 20 MG/ML IJ SOLN: 10.0000 mg | INTRAMUSCULAR | Status: DC | PRN

## 2023-05-21 MED ORDER — SODIUM CHLORIDE 0.9% FLUSH: 3.0000 mL | INTRAVENOUS | Status: DC | PRN

## 2023-05-21 MED ORDER — FENTANYL CITRATE (PF) 100 MCG/2ML IJ SOLN
INTRAMUSCULAR | Status: DC | PRN
  Administered 2023-05-21: 25 ug via INTRAVENOUS

## 2023-05-21 MED ORDER — FENTANYL CITRATE (PF) 100 MCG/2ML IJ SOLN
INTRAMUSCULAR | Status: AC
  Filled 2023-05-21: qty 2

## 2023-05-21 MED ORDER — SODIUM CHLORIDE 0.9 % WEIGHT BASED INFUSION
3.0000 mL/kg/h | INTRAVENOUS | Status: AC
  Administered 2023-05-21: 3 mL/kg/h via INTRAVENOUS

## 2023-05-21 MED ORDER — MIDAZOLAM HCL 2 MG/2ML IJ SOLN
INTRAMUSCULAR | Status: DC | PRN
  Administered 2023-05-21: 1 mg via INTRAVENOUS

## 2023-05-21 MED ORDER — LIDOCAINE HCL (PF) 1 % IJ SOLN
INTRAMUSCULAR | Status: DC | PRN
  Administered 2023-05-21: 2 mL

## 2023-05-21 MED ORDER — ONDANSETRON HCL 4 MG/2ML IJ SOLN: 4.0000 mg | Freq: Four times a day (QID) | INTRAMUSCULAR | Status: DC | PRN

## 2023-05-21 MED ORDER — SODIUM CHLORIDE 0.9% FLUSH: 3.0000 mL | Freq: Two times a day (BID) | INTRAVENOUS | Status: DC

## 2023-05-21 MED ORDER — VERAPAMIL HCL 2.5 MG/ML IV SOLN
INTRAVENOUS | Status: AC
  Filled 2023-05-21: qty 2

## 2023-05-21 MED ORDER — MIDAZOLAM HCL 2 MG/2ML IJ SOLN
INTRAMUSCULAR | Status: AC
  Filled 2023-05-21: qty 2

## 2023-05-21 MED ORDER — ACETAMINOPHEN 325 MG PO TABS: 650.0000 mg | ORAL_TABLET | ORAL | Status: DC | PRN

## 2023-05-21 MED ORDER — HEPARIN (PORCINE) IN NACL 1000-0.9 UT/500ML-% IV SOLN
INTRAVENOUS | Status: DC | PRN
  Administered 2023-05-21 (×2): 500 mL

## 2023-05-21 MED ORDER — HEPARIN SODIUM (PORCINE) 1000 UNIT/ML IJ SOLN
INTRAMUSCULAR | Status: AC
  Filled 2023-05-21: qty 10

## 2023-05-21 MED ORDER — LIDOCAINE HCL 1 % IJ SOLN
INTRAMUSCULAR | Status: AC
  Filled 2023-05-21: qty 20

## 2023-05-21 MED ORDER — HEPARIN (PORCINE) IN NACL 1000-0.9 UT/500ML-% IV SOLN
INTRAVENOUS | Status: AC
  Filled 2023-05-21: qty 1000

## 2023-05-21 MED ORDER — SODIUM CHLORIDE 0.9 % WEIGHT BASED INFUSION: 1.0000 mL/kg/h | INTRAVENOUS | Status: DC

## 2023-05-21 MED ORDER — IOHEXOL 300 MG/ML  SOLN
INTRAMUSCULAR | Status: DC | PRN
  Administered 2023-05-21: 72 mL

## 2023-05-21 MED ORDER — ASPIRIN 81 MG PO CHEW: 81.0000 mg | CHEWABLE_TABLET | ORAL | Status: DC

## 2023-05-21 MED ORDER — HEPARIN SODIUM (PORCINE) 1000 UNIT/ML IJ SOLN
INTRAMUSCULAR | Status: DC | PRN
  Administered 2023-05-21: 4000 [IU] via INTRAVENOUS

## 2023-05-21 MED ORDER — LABETALOL HCL 5 MG/ML IV SOLN: 10.0000 mg | INTRAVENOUS | Status: DC | PRN

## 2023-05-21 SURGICAL SUPPLY — 10 items
CATH 5FR JL3.5 JR4 ANG PIG MP (CATHETERS) IMPLANT
DEVICE RAD TR BAND REGULAR (VASCULAR PRODUCTS) IMPLANT
DRAPE BRACHIAL (DRAPES) IMPLANT
GLIDESHEATH SLEND SS 6F .021 (SHEATH) IMPLANT
GUIDEWIRE INQWIRE 1.5J.035X260 (WIRE) IMPLANT
INQWIRE 1.5J .035X260CM (WIRE) ×1
PACK CARDIAC CATH (CUSTOM PROCEDURE TRAY) ×1 IMPLANT
PROTECTION STATION PRESSURIZED (MISCELLANEOUS) ×1
SET ATX-X65L (MISCELLANEOUS) IMPLANT
STATION PROTECTION PRESSURIZED (MISCELLANEOUS) IMPLANT

## 2023-05-21 NOTE — Progress Notes (Signed)
Obtained post-procedure CBG and called Dr. Salome Arnt with results and he stated to let patient know to restart diabetic medicine today once he gets home rather than waiting 48 hours.  Discussed with patient and patient verbalized understanding of Dr. Ruben Gottron recommendations.

## 2023-05-21 NOTE — Discharge Instructions (Signed)
Keep right wrist elevated on pillow above the heart for today until 3:30 pm tomorrow, 05/22/2023.  Watch right wrist for evidence of bleeding or hematoma.. If bleeding or hematoma noted, lie down flat and hold pressure over the site for at least 15 minutes and notify EMS. Continue holding pressure until they arrive.  No bending or flexing of the wrist--no lifting for the next 24 hours and for 2 weeks after your procedure. Notify the physician for evidence of infection or if you get a temperature.   Radial Site Care Refer to this sheet in the next few weeks. These instructions provide you with information about caring for yourself after your procedure. Your health care provider may also give you more specific instructions. Your treatment has been planned according to current medical practices, but problems sometimes occur. Call your health care provider if you have any problems or questions after your procedure. What can I expect after the procedure? After your procedure, it is typical to have the following: Bruising at the radial site that usually fades within 1-2 weeks. Blood collecting in the tissue (hematoma) that may be painful to the touch. It should usually decrease in size and tenderness within 1-2 weeks.  Follow these instructions at home: Take medicines only as directed by your health care provider. If you are on a medication called Metformin please do not take for 48 hours after your procedure. Over the next 48hrs please increase your fluid intake of water and non caffeine beverages to flush the contrast dye out of your system.  You may shower 24 hours after the procedure  Leave your bandage on and gently wash the site with plain soap and water. Pat the area dry with a clean towel. Do not rub the site, because this may cause bleeding.  Remove your dressing 48hrs after your procedure and leave open to air.  Do not submerge your site in water for 7 days. This includes swimming and washing  dishes.  Check your insertion site every day for redness, swelling, or drainage. Do not apply powder or lotion to the site. Do not flex or bend the affected arm for 24 hours or as directed by your health care provider. Do not push or pull heavy objects with the affected arm for 24 hours or as directed by your health care provider. Do not lift over 10 lb (4.5 kg) for 5 days after your procedure or as directed by your health care provider. Ask your health care provider when it is okay to: Return to work or school. Resume usual physical activities or sports. Resume sexual activity. Do not drive home if you are discharged the same day as the procedure. Have someone else drive you. You may drive 48 hours after the procedure Do not operate machinery or power tools for 24 hours after the procedure. If your procedure was done as an outpatient procedure, which means that you went home the same day as your procedure, a responsible adult should be with you for the first 24 hours after you arrive home. Keep all follow-up visits as directed by your health care provider. This is important. Contact a health care provider if: You have a fever. You have chills. You have increased bleeding from the radial site. Hold pressure on the site. Get help right away if: You have unusual pain at the radial site. You have redness, warmth, or swelling at the radial site. You have drainage (other than a small amount of blood on the dressing) from  the radial site. The radial site is bleeding, and the bleeding does not stop after 15 minutes of holding steady pressure on the site. Your arm or hand becomes pale, cool, tingly, or numb. This information is not intended to replace advice given to you by your health care provider. Make sure you discuss any questions you have with your health care provider. Document Released: 09/08/2010 Document Revised: 01/12/2016 Document Reviewed: 02/22/2014 Elsevier Interactive Patient  Education  2018 [employer].

## 2023-05-22 LAB — CARDIAC CATHETERIZATION: Cath EF Quantitative: 55 %

## 2023-05-23 ENCOUNTER — Encounter: Payer: Self-pay | Admitting: Internal Medicine

## 2023-06-12 DIAGNOSIS — I10 Essential (primary) hypertension: Secondary | ICD-10-CM | POA: Diagnosis not present

## 2023-06-12 DIAGNOSIS — E782 Mixed hyperlipidemia: Secondary | ICD-10-CM | POA: Diagnosis not present

## 2023-06-12 DIAGNOSIS — I2510 Atherosclerotic heart disease of native coronary artery without angina pectoris: Secondary | ICD-10-CM | POA: Insufficient documentation

## 2023-06-12 DIAGNOSIS — R079 Chest pain, unspecified: Secondary | ICD-10-CM | POA: Diagnosis not present

## 2023-07-15 DIAGNOSIS — M5416 Radiculopathy, lumbar region: Secondary | ICD-10-CM | POA: Diagnosis not present

## 2023-07-15 DIAGNOSIS — M4310 Spondylolisthesis, site unspecified: Secondary | ICD-10-CM | POA: Diagnosis not present

## 2023-07-15 DIAGNOSIS — M4316 Spondylolisthesis, lumbar region: Secondary | ICD-10-CM | POA: Diagnosis not present

## 2023-07-30 DIAGNOSIS — M51362 Other intervertebral disc degeneration, lumbar region with discogenic back pain and lower extremity pain: Secondary | ICD-10-CM | POA: Diagnosis not present

## 2023-07-30 DIAGNOSIS — I2510 Atherosclerotic heart disease of native coronary artery without angina pectoris: Secondary | ICD-10-CM | POA: Diagnosis not present

## 2023-07-30 DIAGNOSIS — E119 Type 2 diabetes mellitus without complications: Secondary | ICD-10-CM | POA: Diagnosis not present

## 2023-07-30 DIAGNOSIS — J209 Acute bronchitis, unspecified: Secondary | ICD-10-CM | POA: Diagnosis not present

## 2023-07-30 DIAGNOSIS — I10 Essential (primary) hypertension: Secondary | ICD-10-CM | POA: Diagnosis not present

## 2023-08-07 DIAGNOSIS — I10 Essential (primary) hypertension: Secondary | ICD-10-CM | POA: Diagnosis not present

## 2023-08-07 DIAGNOSIS — R0602 Shortness of breath: Secondary | ICD-10-CM | POA: Diagnosis not present

## 2023-08-07 DIAGNOSIS — E119 Type 2 diabetes mellitus without complications: Secondary | ICD-10-CM | POA: Diagnosis not present

## 2023-08-07 DIAGNOSIS — I2510 Atherosclerotic heart disease of native coronary artery without angina pectoris: Secondary | ICD-10-CM | POA: Diagnosis not present

## 2023-08-07 DIAGNOSIS — I2089 Other forms of angina pectoris: Secondary | ICD-10-CM | POA: Diagnosis not present

## 2023-08-07 DIAGNOSIS — E782 Mixed hyperlipidemia: Secondary | ICD-10-CM | POA: Diagnosis not present

## 2023-08-07 DIAGNOSIS — I2584 Coronary atherosclerosis due to calcified coronary lesion: Secondary | ICD-10-CM | POA: Diagnosis not present

## 2023-08-27 ENCOUNTER — Encounter
Admission: RE | Disposition: A | Discharge status: Home / Self Care | Payer: Self-pay | Source: Ambulatory Visit | Attending: Cardiology

## 2023-08-27 ENCOUNTER — Other Ambulatory Visit: Payer: Self-pay

## 2023-08-27 ENCOUNTER — Ambulatory Visit
Admission: RE | Admit: 2023-08-27 | Discharge: 2023-08-27 | Disposition: A | Discharge status: Home / Self Care | Payer: 59 | Source: Ambulatory Visit | Attending: Cardiology | Admitting: Cardiology

## 2023-08-27 ENCOUNTER — Encounter: Payer: Self-pay | Admitting: Cardiology

## 2023-08-27 DIAGNOSIS — R079 Chest pain, unspecified: Secondary | ICD-10-CM | POA: Insufficient documentation

## 2023-08-27 DIAGNOSIS — I2510 Atherosclerotic heart disease of native coronary artery without angina pectoris: Secondary | ICD-10-CM | POA: Insufficient documentation

## 2023-08-27 HISTORY — PX: CORONARY STENT INTERVENTION: CATH118234

## 2023-08-27 HISTORY — PX: CORONARY PRESSURE/FFR STUDY: CATH118243

## 2023-08-27 LAB — GLUCOSE, CAPILLARY: Glucose-Capillary: 229 mg/dL — ABNORMAL HIGH (ref 70–99)

## 2023-08-27 LAB — POCT ACTIVATED CLOTTING TIME: Activated Clotting Time: 371 s

## 2023-08-27 SURGERY — CORONARY STENT INTERVENTION
Anesthesia: Moderate Sedation

## 2023-08-27 MED ORDER — VERAPAMIL HCL 2.5 MG/ML IV SOLN
INTRAVENOUS | Status: AC
  Filled 2023-08-27: qty 2

## 2023-08-27 MED ORDER — MIDAZOLAM HCL 2 MG/2ML IJ SOLN
INTRAMUSCULAR | Status: AC
Start: 2023-08-27 — End: ?
  Filled 2023-08-27: qty 2

## 2023-08-27 MED ORDER — LIDOCAINE HCL (PF) 1 % IJ SOLN
INTRAMUSCULAR | Status: DC | PRN
  Administered 2023-08-27: 2 mL

## 2023-08-27 MED ORDER — SODIUM CHLORIDE 0.9 % IV SOLN: 250.0000 mL | INTRAVENOUS | Status: DC | PRN

## 2023-08-27 MED ORDER — SODIUM CHLORIDE 0.9 % WEIGHT BASED INFUSION
3.0000 mL/kg/h | INTRAVENOUS | Status: AC
  Administered 2023-08-27: 3 mL/kg/h via INTRAVENOUS

## 2023-08-27 MED ORDER — HEPARIN (PORCINE) IN NACL 1000-0.9 UT/500ML-% IV SOLN
INTRAVENOUS | Status: AC
  Filled 2023-08-27: qty 1000

## 2023-08-27 MED ORDER — SODIUM CHLORIDE 0.9 % WEIGHT BASED INFUSION
1.0000 mL/kg/h | INTRAVENOUS | Status: DC
  Administered 2023-08-27: 1 mL/kg/h via INTRAVENOUS

## 2023-08-27 MED ORDER — SODIUM CHLORIDE 0.9% FLUSH: 3.0000 mL | Freq: Two times a day (BID) | INTRAVENOUS | Status: DC

## 2023-08-27 MED ORDER — HEPARIN SODIUM (PORCINE) 1000 UNIT/ML IJ SOLN
INTRAMUSCULAR | Status: AC
  Filled 2023-08-27: qty 10

## 2023-08-27 MED ORDER — HEPARIN (PORCINE) IN NACL 2000-0.9 UNIT/L-% IV SOLN
INTRAVENOUS | Status: DC | PRN
  Administered 2023-08-27: 1000 mL

## 2023-08-27 MED ORDER — LIDOCAINE HCL 1 % IJ SOLN
INTRAMUSCULAR | Status: AC
  Filled 2023-08-27: qty 20

## 2023-08-27 MED ORDER — ASPIRIN 81 MG PO CHEW: 81.0000 mg | CHEWABLE_TABLET | ORAL | Status: DC

## 2023-08-27 MED ORDER — HEPARIN SODIUM (PORCINE) 1000 UNIT/ML IJ SOLN
INTRAMUSCULAR | Status: DC | PRN
  Administered 2023-08-27: 9000 [IU] via INTRAVENOUS

## 2023-08-27 MED ORDER — MIDAZOLAM HCL 2 MG/2ML IJ SOLN
INTRAMUSCULAR | Status: DC | PRN
  Administered 2023-08-27: 1 mg via INTRAVENOUS

## 2023-08-27 MED ORDER — FENTANYL CITRATE (PF) 100 MCG/2ML IJ SOLN
INTRAMUSCULAR | Status: DC | PRN
  Administered 2023-08-27: 25 ug via INTRAVENOUS

## 2023-08-27 MED ORDER — IOHEXOL 300 MG/ML  SOLN
INTRAMUSCULAR | Status: DC | PRN
  Administered 2023-08-27: 87 mL

## 2023-08-27 MED ORDER — VERAPAMIL HCL 2.5 MG/ML IV SOLN
INTRAVENOUS | Status: DC | PRN
  Administered 2023-08-27: 2.5 mg via INTRA_ARTERIAL

## 2023-08-27 MED ORDER — FENTANYL CITRATE (PF) 100 MCG/2ML IJ SOLN
INTRAMUSCULAR | Status: AC
  Filled 2023-08-27: qty 2

## 2023-08-27 MED ORDER — SODIUM CHLORIDE 0.9% FLUSH: 3.0000 mL | INTRAVENOUS | Status: DC | PRN

## 2023-08-27 SURGICAL SUPPLY — 14 items
CATH VISTA GUIDE 6FR JL3.5 (CATHETERS) IMPLANT
DEVICE RAD TR BAND REGULAR (VASCULAR PRODUCTS) IMPLANT
DRAPE BRACHIAL (DRAPES) IMPLANT
GLIDESHEATH SLEND SS 6F .021 (SHEATH) IMPLANT
GUIDEWIRE INQWIRE 1.5J.035X260 (WIRE) IMPLANT
GUIDEWIRE PRESSURE X 175 (WIRE) IMPLANT
INQWIRE 1.5J .035X260CM (WIRE) ×1
KIT ENCORE 26 ADVANTAGE (KITS) IMPLANT
KIT SYRINGE INJ CVI SPIKEX1 (MISCELLANEOUS) IMPLANT
PACK CARDIAC CATH (CUSTOM PROCEDURE TRAY) ×1 IMPLANT
PROTECTION STATION PRESSURIZED (MISCELLANEOUS) ×1
SET ATX-X65L (MISCELLANEOUS) IMPLANT
STATION PROTECTION PRESSURIZED (MISCELLANEOUS) IMPLANT
TUBING CIL FLEX 10 FLL-RA (TUBING) IMPLANT

## 2023-09-03 DIAGNOSIS — I2585 Chronic coronary microvascular dysfunction: Secondary | ICD-10-CM | POA: Diagnosis not present

## 2023-09-03 DIAGNOSIS — I2510 Atherosclerotic heart disease of native coronary artery without angina pectoris: Secondary | ICD-10-CM | POA: Diagnosis not present

## 2023-09-03 DIAGNOSIS — E119 Type 2 diabetes mellitus without complications: Secondary | ICD-10-CM | POA: Diagnosis not present

## 2023-09-03 DIAGNOSIS — E782 Mixed hyperlipidemia: Secondary | ICD-10-CM | POA: Diagnosis not present

## 2023-09-03 DIAGNOSIS — I10 Essential (primary) hypertension: Secondary | ICD-10-CM | POA: Diagnosis not present

## 2023-11-22 ENCOUNTER — Encounter (HOSPITAL_COMMUNITY): Payer: Self-pay | Admitting: Emergency Medicine

## 2023-11-22 ENCOUNTER — Emergency Department (HOSPITAL_COMMUNITY)
Admission: EM | Admit: 2023-11-22 | Discharge: 2023-11-22 | Disposition: A | Discharge status: Home / Self Care | Attending: Emergency Medicine | Admitting: Emergency Medicine

## 2023-11-22 ENCOUNTER — Emergency Department (HOSPITAL_COMMUNITY)

## 2023-11-22 ENCOUNTER — Other Ambulatory Visit: Payer: Self-pay

## 2023-11-22 DIAGNOSIS — Z7984 Long term (current) use of oral hypoglycemic drugs: Secondary | ICD-10-CM | POA: Insufficient documentation

## 2023-11-22 DIAGNOSIS — R079 Chest pain, unspecified: Secondary | ICD-10-CM | POA: Diagnosis not present

## 2023-11-22 DIAGNOSIS — R42 Dizziness and giddiness: Secondary | ICD-10-CM | POA: Diagnosis not present

## 2023-11-22 DIAGNOSIS — I10 Essential (primary) hypertension: Secondary | ICD-10-CM | POA: Diagnosis not present

## 2023-11-22 DIAGNOSIS — Z79899 Other long term (current) drug therapy: Secondary | ICD-10-CM | POA: Diagnosis not present

## 2023-11-22 DIAGNOSIS — Z7982 Long term (current) use of aspirin: Secondary | ICD-10-CM | POA: Insufficient documentation

## 2023-11-22 DIAGNOSIS — E119 Type 2 diabetes mellitus without complications: Secondary | ICD-10-CM | POA: Insufficient documentation

## 2023-11-22 DIAGNOSIS — R0789 Other chest pain: Secondary | ICD-10-CM | POA: Diagnosis not present

## 2023-11-22 LAB — BASIC METABOLIC PANEL WITH GFR
Anion gap: 13 (ref 5–15)
BUN: 16 mg/dL (ref 6–20)
CO2: 23 mmol/L (ref 22–32)
Calcium: 9.9 mg/dL (ref 8.9–10.3)
Chloride: 98 mmol/L (ref 98–111)
Creatinine, Ser: 1.11 mg/dL (ref 0.61–1.24)
GFR, Estimated: >60 mL/min (ref >60)
Glucose, Bld: 175 mg/dL — ABNORMAL HIGH (ref 70–99)
Potassium: 3.9 mmol/L (ref 3.5–5.1)
Sodium: 134 mmol/L — ABNORMAL LOW (ref 135–145)

## 2023-11-22 LAB — CBC
HCT: 42.4 % (ref 39.0–52.0)
Hemoglobin: 15.1 g/dL (ref 13.0–17.0)
MCH: 31.7 pg (ref 26.0–34.0)
MCHC: 35.6 g/dL (ref 30.0–36.0)
MCV: 88.9 fL (ref 80.0–100.0)
Platelets: 184 10*3/uL (ref 150–400)
RBC: 4.77 MIL/uL (ref 4.22–5.81)
RDW: 12.8 % (ref 11.5–15.5)
WBC: 8.6 10*3/uL (ref 4.0–10.5)
nRBC: 0 % (ref 0.0–0.2)

## 2023-11-22 LAB — TROPONIN I (HIGH SENSITIVITY)
Troponin I (High Sensitivity): 8 ng/L (ref <18)
Troponin I (High Sensitivity): 11 ng/L (ref <18)

## 2023-11-22 LAB — CK: Total CK: 97 U/L (ref 49–397)

## 2023-11-22 MED ORDER — SODIUM CHLORIDE 0.9% IV SOLUTION: Freq: Once | INTRAVENOUS | Status: DC

## 2023-11-22 NOTE — ED Notes (Signed)
Pt resting no complaints at this time. 

## 2023-11-22 NOTE — Discharge Instructions (Signed)
 You were seen in the emergency department today with concerns of chest pain.  Your labs, imaging, and EKG were thankfully all reassuring with no abnormal finding seen.  It does not appear that you are experiencing a heart attack today.  I would show I recommend following up with her cardiologist for further evaluation.  Continue other current medications as prescribed.  For any concerns of new or worsening symptoms, return to the emergency department.

## 2023-11-22 NOTE — ED Notes (Signed)
 Pt care taken, no pain, awaiting results and disposition

## 2023-11-22 NOTE — ED Provider Notes (Signed)
  EMERGENCY DEPARTMENT AT [facility] Provider Note   CSN: [id number] Arrival date & time: 11/22/23  1620     History Chief Complaint  Patient presents with   Chest Pain    Keith Dorsey is a 58 y.o. male.  Patient with past history significant for hypertension, diabetes, episodes of angina who presents emergency department today with concerns of chest pain.  Patient reports that he was working when he began to experience chest pain.  States that he experienced chest pain for about an hour prior to arriving and he noted improvement with rest but did not resolve.  Patient was given sublingual nitroglycerin by EMS en route and states that pain has now resolved.  States that the pain is not at this time present and was not radiating towards his arms or his back but did have some radiation towards the neck.  No nausea, vomiting, diaphoresis when the pain started.  Currently follows with Dr.Alluri, cardiology at the [facility] clinic.  He reports that he has had 2 different heart caths in the last several months to evaluate his atypical chest pain with no findings at those times.   Chest Pain      Home Medications Prior to Admission medications   Medication Sig Start Date End Date Taking? Authorizing Provider  aspirin EC 81 MG tablet Take 81 mg by mouth daily. Swallow whole.    [provider]  citalopram (CELEXA) 10 MG tablet Take 10 mg by mouth daily.    [provider]  DiphenhydrAMINE HCl, Sleep, (UNISOM SLEEPGELS) 50 MG CAPS Take 2 capsules by mouth at bedtime.    [provider]  glipiZIDE (GLUCOTROL XL) 10 MG 24 hr tablet Take 10 mg by mouth daily with breakfast.    [provider]  isosorbide dinitrate (ISORDIL) 30 MG tablet Take 30 mg by mouth 4 (four) times daily.    [provider]  lisinopril (PRINIVIL,ZESTRIL) 10 MG tablet Take 10 mg by mouth daily.    [provider]  metFORMIN (GLUCOPHAGE) 500 MG tablet  Take 500 mg by mouth 2 (two) times daily with a meal.  10/11/15 10/10/16  [provider]  metoprolol tartrate (LOPRESSOR) 25 MG tablet Take 25 mg by mouth 2 (two) times daily.    [provider]  traZODone (DESYREL) 50 MG tablet Take 50 mg by mouth at bedtime.    [provider]      Allergies    Other    Review of Systems   Review of Systems  Cardiovascular:  Positive for chest pain.  All other systems reviewed and are negative.   Physical Exam Updated Vital Signs BP (!) 161/95   Pulse 65   Temp 98.1 F (36.7 C)   Resp 14   SpO2 98%  Physical Exam Vitals and nursing note reviewed.  Constitutional:      General: He is not in acute distress.    Appearance: He is well-developed.  HENT:     Head: Normocephalic and atraumatic.  Eyes:     Conjunctiva/sclera: Conjunctivae normal.  Cardiovascular:     Rate and Rhythm: Normal rate and regular rhythm.     Heart sounds: No murmur heard. Pulmonary:     Effort: Pulmonary effort is normal. No respiratory distress.     Breath sounds: No decreased breath sounds, wheezing, rhonchi or rales.  Abdominal:     Palpations: Abdomen is soft.     Tenderness: There is no abdominal tenderness.  Musculoskeletal:        General: No swelling.     Cervical back: Neck supple.  Skin:    General: Skin is warm and dry.     Capillary Refill: Capillary refill takes less than 2 seconds.  Neurological:     Mental Status: He is alert.  Psychiatric:        Mood and Affect: Mood normal.     ED Results / Procedures / Treatments   Labs (all labs ordered are listed, but only abnormal results are displayed) Labs Reviewed  BASIC METABOLIC PANEL WITH GFR - Abnormal; Notable for the following components:      Result Value   Sodium 134 (*)    Glucose, Bld 175 (*)    All other components within normal limits  CBC  CK  TROPONIN I (HIGH SENSITIVITY)  TROPONIN I (HIGH SENSITIVITY)    EKG EKG  Interpretation Date/Time:  Friday November 22 2023 16:29:52 EDT Ventricular Rate:  104 PR Interval:  160 QRS Duration:  90 QT Interval:  350 QTC Calculation: 460 R Axis:   -32  Text Interpretation: Sinus tachycardia Left axis deviation Confirmed by Alvino Blood ([id number]) on 11/22/2023 7:22:06 PM  Radiology DG Chest 2 View Result Date: 11/22/2023 CLINICAL DATA:  Chest pain EXAM: CHEST - 2 VIEW COMPARISON:  None Available. FINDINGS: Normal mediastinum and cardiac silhouette. Normal pulmonary vasculature. No evidence of effusion, infiltrate, or pneumothorax. No acute bony abnormality. IMPRESSION: No acute cardiopulmonary process. Electronically Signed   By: Genevive Bi M.D.   On: 11/22/2023 17:43    Procedures Procedures    Medications Ordered in ED Medications - No data to display  ED Course/ Medical Decision Making/ A&P                                 Medical Decision Making Amount and/or Complexity of Data Reviewed Labs: ordered. Radiology: ordered.   This patient presents to the ED for concern of chest pain.  Differential diagnosis includes ACS, PE, pneumonia, bronchitis, pneumothorax, sepsis   Lab Tests:  I Ordered, and personally interpreted labs.  The pertinent results include: CBC unremarkable, BMP with mild hyponatremia 134 but no evidence of AKI or dehydration, troponin initially at 8 with delta at 11, CK pending   Imaging Studies ordered:  I ordered imaging studies including chest x-ray I independently visualized and interpreted imaging which showed no acute cardiopulmonary process I agree with the radiologist interpretation   Problem List / ED Course:  Patient presents to the emergency department today with concerns of chest pain.  Past history significant for hypertension, diabetes, and episodes of angina who currently follows with Dr.Alluri, cardiology at [facility] clinic.  He reports that he has been worked up previously for episodes of chest pain with  heart catheterizations with no abnormal findings seen.  He reports that he uses aspirin and is on blood pressure medications and has not been missing any doses of medications.  At this time, endorses minimal nausea but denies any vomiting. Physical exam is unremarkable.  No abnormal heart or lung sounds.  No appreciable heart murmurs, rubs, or gallops.  No focal abdominal tenderness and no reproducible chest wall tenderness.  Will proceed with cardiac workup with labs, EKG and chest x-ray. EKG shows no ischemic findings.  Chest x-ray unremarkable.  Will wait for labs for further disposition. Labs unremarkable.  Troponin initially at 8 with delta troponin at 11.  Insignificant elevation.  Given reassuring workup, believe the patient stable for outpatient follow-up with his cardiologist.  Advised strict return precautions.  Patient verbalized understanding indications to return to the emergency department for further evaluation.  Patient was discharged home in stable condition with plan for outpatient follow-up.   Final Clinical Impression(s) / ED Diagnoses Final diagnoses:  Other chest pain    Rx / DC Orders ED Discharge Orders     None         Smitty Knudsen, PA-C 11/22/23 2048    Lonell Grandchild, MD 11/25/23 [phone]

## 2023-11-22 NOTE — ED Notes (Signed)
 Pt ambulated to restroom without assistance.

## 2023-11-22 NOTE — ED Triage Notes (Signed)
 Pt BIB by EMS for CP x 1 hours. 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 Endorses mild improvement with rest but not completely resolved. SL nitroglycerin given en route. Pain 1/10. Denies any movement or radiation.

## 2023-11-22 NOTE — ED Notes (Signed)
 CK added on by [facility] in lab

## 2023-11-22 NOTE — ED Provider Triage Note (Signed)
 Emergency Medicine Provider Triage Evaluation Note  Keith Dorsey , a 58 y.o. male  was evaluated in triage.  Pt complains of general weakness, chest pain.  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  The chest pain did not radiate and he had to lie down and after an hour felt better.  Patient received full dose aspirin and nitro from EMS and states this helped.  Patient has had cath in the past that have been unremarkable and sees cardiology in [place] through [place].  Patient denies any shortness of breath.  Last cath:   Mid LAD lesion is 50% stenosed.   Ost LAD to Prox LAD lesion is 30% stenosed.    Review of Systems  Positive:  Negative:   Physical Exam  BP (!) 136/104 (BP Location: Right Arm)   Pulse (!) 107   Temp 98.2 F (36.8 C) (Oral)   Resp 18   SpO2 99%  Gen:   Awake, no distress   Resp:  Normal effort  MSK:   Moves extremities without difficulty  Other:    Medical Decision Making  Medically screening exam initiated at 5:29 PM.  Appropriate orders placed.  Keith Dorsey was informed that the remainder of the evaluation will be completed by another provider, this initial triage assessment does not replace that evaluation, and the importance of remaining in the ED until their evaluation is complete.  Workup initiated, patient is stable at this time.  I do question if patient is dehydrated or has rhabdo as it is hot outside and patient does appear sunburned.  Will order chest pain labs along with CK.   Netta Corrigan, PA-C 11/22/23 1731

## 2024-02-24 DIAGNOSIS — Z125 Encounter for screening for malignant neoplasm of prostate: Secondary | ICD-10-CM | POA: Diagnosis not present

## 2024-02-24 DIAGNOSIS — I10 Essential (primary) hypertension: Secondary | ICD-10-CM | POA: Diagnosis not present

## 2024-02-24 DIAGNOSIS — E782 Mixed hyperlipidemia: Secondary | ICD-10-CM | POA: Diagnosis not present

## 2024-02-24 DIAGNOSIS — Z23 Encounter for immunization: Secondary | ICD-10-CM | POA: Diagnosis not present

## 2024-02-24 DIAGNOSIS — F419 Anxiety disorder, unspecified: Secondary | ICD-10-CM | POA: Diagnosis not present

## 2024-02-24 DIAGNOSIS — I2510 Atherosclerotic heart disease of native coronary artery without angina pectoris: Secondary | ICD-10-CM | POA: Diagnosis not present

## 2024-02-24 DIAGNOSIS — M51362 Other intervertebral disc degeneration, lumbar region with discogenic back pain and lower extremity pain: Secondary | ICD-10-CM | POA: Diagnosis not present

## 2024-02-24 DIAGNOSIS — Z1211 Encounter for screening for malignant neoplasm of colon: Secondary | ICD-10-CM | POA: Diagnosis not present

## 2024-02-24 DIAGNOSIS — N529 Male erectile dysfunction, unspecified: Secondary | ICD-10-CM | POA: Diagnosis not present

## 2024-02-24 DIAGNOSIS — F5101 Primary insomnia: Secondary | ICD-10-CM | POA: Diagnosis not present

## 2024-02-24 DIAGNOSIS — N486 Induration penis plastica: Secondary | ICD-10-CM | POA: Diagnosis not present

## 2024-02-24 DIAGNOSIS — E119 Type 2 diabetes mellitus without complications: Secondary | ICD-10-CM | POA: Diagnosis not present

## 2024-02-24 DIAGNOSIS — F331 Major depressive disorder, recurrent, moderate: Secondary | ICD-10-CM | POA: Diagnosis not present
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
## 2024-02-28 ENCOUNTER — Other Ambulatory Visit: Payer: Self-pay | Admitting: Family Medicine

## 2024-02-28 ENCOUNTER — Inpatient Hospital Stay
Admission: RE | Admit: 2024-02-28 | Discharge: 2024-02-28 | Disposition: A | Discharge status: Home / Self Care | Payer: Self-pay | Source: Ambulatory Visit | Attending: Physician Assistant | Admitting: Physician Assistant

## 2024-02-28 DIAGNOSIS — Z049 Encounter for examination and observation for unspecified reason: Secondary | ICD-10-CM

## 2024-03-02 DIAGNOSIS — F419 Anxiety disorder, unspecified: Secondary | ICD-10-CM | POA: Diagnosis not present

## 2024-03-02 DIAGNOSIS — I2510 Atherosclerotic heart disease of native coronary artery without angina pectoris: Secondary | ICD-10-CM | POA: Diagnosis not present

## 2024-03-02 DIAGNOSIS — I10 Essential (primary) hypertension: Secondary | ICD-10-CM | POA: Diagnosis not present

## 2024-03-02 DIAGNOSIS — F331 Major depressive disorder, recurrent, moderate: Secondary | ICD-10-CM | POA: Diagnosis not present

## 2024-03-02 DIAGNOSIS — E119 Type 2 diabetes mellitus without complications: Secondary | ICD-10-CM | POA: Diagnosis not present

## 2024-03-03 DIAGNOSIS — E782 Mixed hyperlipidemia: Secondary | ICD-10-CM | POA: Diagnosis not present

## 2024-03-03 DIAGNOSIS — E119 Type 2 diabetes mellitus without complications: Secondary | ICD-10-CM | POA: Diagnosis not present

## 2024-03-03 DIAGNOSIS — I10 Essential (primary) hypertension: Secondary | ICD-10-CM | POA: Diagnosis not present

## 2024-03-03 DIAGNOSIS — I2510 Atherosclerotic heart disease of native coronary artery without angina pectoris: Secondary | ICD-10-CM | POA: Diagnosis not present

## 2024-03-04 ENCOUNTER — Ambulatory Visit
Admission: RE | Admit: 2024-03-04 | Discharge: 2024-03-04 | Disposition: A | Discharge status: Home / Self Care | Attending: Physician Assistant | Admitting: Physician Assistant

## 2024-03-04 ENCOUNTER — Ambulatory Visit: Admitting: Physician Assistant

## 2024-03-04 ENCOUNTER — Ambulatory Visit
Admission: RE | Admit: 2024-03-04 | Discharge: 2024-03-04 | Disposition: A | Discharge status: Home / Self Care | Source: Ambulatory Visit | Attending: Physician Assistant | Admitting: Physician Assistant

## 2024-03-04 VITALS — BP 154/100 | Ht 68.0 in | Wt 182.2 lb

## 2024-03-04 DIAGNOSIS — M545 Low back pain, unspecified: Secondary | ICD-10-CM | POA: Insufficient documentation

## 2024-03-04 DIAGNOSIS — R200 Anesthesia of skin: Secondary | ICD-10-CM

## 2024-03-04 DIAGNOSIS — R202 Paresthesia of skin: Secondary | ICD-10-CM | POA: Insufficient documentation

## 2024-03-04 DIAGNOSIS — G8929 Other chronic pain: Secondary | ICD-10-CM | POA: Insufficient documentation

## 2024-03-04 DIAGNOSIS — M48061 Spinal stenosis, lumbar region without neurogenic claudication: Secondary | ICD-10-CM | POA: Diagnosis not present

## 2024-03-04 DIAGNOSIS — M47816 Spondylosis without myelopathy or radiculopathy, lumbar region: Secondary | ICD-10-CM | POA: Diagnosis not present

## 2024-03-04 DIAGNOSIS — M5126 Other intervertebral disc displacement, lumbar region: Secondary | ICD-10-CM | POA: Diagnosis not present

## 2024-03-04 DIAGNOSIS — M51369 Other intervertebral disc degeneration, lumbar region without mention of lumbar back pain or lower extremity pain: Secondary | ICD-10-CM | POA: Diagnosis not present

## 2024-03-18 ENCOUNTER — Encounter: Payer: Self-pay | Admitting: Physician Assistant

## 2024-03-20 ENCOUNTER — Encounter: Payer: Self-pay | Admitting: Neurology

## 2024-03-23 ENCOUNTER — Other Ambulatory Visit

## 2024-03-24 ENCOUNTER — Ambulatory Visit: Admitting: Urology

## 2024-03-24 ENCOUNTER — Encounter: Payer: Self-pay | Admitting: Urology

## 2024-03-24 VITALS — BP 151/93 | HR 74 | Ht 68.0 in | Wt 182.0 lb

## 2024-03-24 DIAGNOSIS — R399 Unspecified symptoms and signs involving the genitourinary system: Secondary | ICD-10-CM | POA: Diagnosis not present

## 2024-03-24 DIAGNOSIS — N529 Male erectile dysfunction, unspecified: Secondary | ICD-10-CM

## 2024-03-24 DIAGNOSIS — Z125 Encounter for screening for malignant neoplasm of prostate: Secondary | ICD-10-CM | POA: Diagnosis not present

## 2024-03-24 DIAGNOSIS — N486 Induration penis plastica: Secondary | ICD-10-CM | POA: Diagnosis not present

## 2024-03-24 NOTE — Progress Notes (Signed)
   03/24/24 2:26 PM   Keith Dorsey 08-Apr-1966 [id number]  CC: ED, penile curvature, urinary symptoms  HPI: 58 year old male with cardiac history remains on daily Imdur referred for penile curvature, ED, and urinary symptoms.  He reports about a year and a half of stable curvature approximately 30 degrees upward, no pain with erections, this does not prevent sexual activity or cause pain with sexual activity.  He does have some problems maintaining erections.  He also reports some weak urinary stream, nocturia 0-1 time overnight.  He deferred PVR today when offered.  Denies any dysuria or gross hematuria.  PSA normal at 0.82 from July 2025.   PMH: Past Medical History:  Diagnosis Date   Cancer (HCC) 2001   Colon   Diabetes mellitus without complication (HCC)    Hypertension     Surgical History: Past Surgical History:  Procedure Laterality Date   COLON SURGERY     CORONARY PRESSURE/FFR STUDY N/A 08/27/2023   Procedure: CORONARY PRESSURE/FFR STUDY;  Surgeon: Ammon Blunt, MD;  Location: [facility] INVASIVE CV LAB;  Service: Cardiovascular;  Laterality: N/A;   CORONARY STENT INTERVENTION N/A 08/27/2023   Procedure: CORONARY STENT INTERVENTION;  Surgeon: Ammon Blunt, MD;  Location: [facility] INVASIVE CV LAB;  Service: Cardiovascular;  Laterality: N/A;   LEFT HEART CATH AND CORONARY ANGIOGRAPHY Left 05/21/2023   Procedure: LEFT HEART CATH AND CORONARY ANGIOGRAPHY;  Surgeon: Florencio Cara BIRCH, MD;  Location: [facility] INVASIVE CV LAB;  Service: Cardiovascular;  Laterality: Left;   Family History: No family history on file.  Social History:  reports that he has never smoked. He has never used smokeless tobacco. He reports current alcohol use of about 24.0 standard drinks of alcohol per week. He reports that he does not use drugs.  Physical Exam: BP (!) 151/93   Pulse 74   Ht 5' 8 (1.727 m)   Wt 182 lb (82.6 kg)   BMI 27.67 kg/m    Constitutional:  Alert and oriented, No acute  distress. Cardiovascular: No clubbing, cyanosis, or edema. Respiratory: Normal respiratory effort, no increased work of breathing. 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  In terms of his ED, cannot prescribe PDE 5 inhibitors while taking daily nitrate, I recommended he reach out to his cardiologist to see if he can come off that medication or have an alternative.  Could trial daily 5 mg Cialis for both urinary symptoms and ED if able to stop nitrate.  Reassurance provided regarding normal PSA.  He will reach out to his cardiologist to see if he can come off his daily Imdur, if so okay to prescribe Cialis 5 mg daily for both BPH and ED   Redell Burnet, MD 03/24/2024  [facility] [address], [address] [place], [place] [address] [phone]

## 2024-03-24 NOTE — Patient Instructions (Signed)
 Visit xiaflex.com for more information about peyronies disease.  This is a benign(noncancerous) scar tissue in the penis that can cause curve with erections and problem with erections.    Talk to your cardiologist about stopping the Imdur(nitrate).  We cannot prescribe medications like Cialis to help with erections unless you are off that medicine.  If you are able to stop the Imdur, reach out to our clinic and we can prescribe Cialis daily to help with both your urinary symptoms and erections

## 2024-04-09 DIAGNOSIS — Z008 Encounter for other general examination: Secondary | ICD-10-CM | POA: Diagnosis not present

## 2024-05-11 ENCOUNTER — Ambulatory Visit: Admitting: Neurology

## 2024-05-11 ENCOUNTER — Other Ambulatory Visit: Payer: Self-pay

## 2024-05-11 DIAGNOSIS — R202 Paresthesia of skin: Secondary | ICD-10-CM | POA: Diagnosis not present

## 2024-05-11 NOTE — Procedures (Signed)
  [facility]  [address] [place], [address]  [place], [place] [address] Tel: [phone] Fax: [phone] Test Date:  05/11/2024  Patient: Keith Dorsey DOB: 07-09-1966 Physician: Venetia Potters, MD  Sex: Male Height: 5' 8 Ref Phys: Lyle Ulis RIGGERS  ID#: [id number]   Technician:    History: This is a 58 year old male with right lower limb numbness and tingling.   NCV & EMG Findings: Extensive electrodiagnostic evaluation of the right lower limb shows: Right sural and superficial peroneal/fibular sensory responses are within normal limits. Right peroneal/fibular (EDB) and tibial (AH) motor responses are within normal limits. Right H reflex latency is within normal limits. There is no evidence of active or chronic motor axon loss changes affecting any of the tested muscles. Motor unit configuration and recruitment pattern is within normal limits.  Impression: This is a normal study of the right lower limb. In particular, there is no electrodiagnostic evidence of a right lumbosacral (L3-S1) radiculopathy, large fiber sensorimotor neuropathy, or right peroneal mononeuropathy.     ___________________________ Venetia Potters, MD    Nerve Conduction Studies Motor Nerve Results    Latency Amplitude F-Lat Segment Distance CV Comment  Site (ms) Norm (mV) Norm (ms)  (cm) (m/s) Norm   Right Fibular (EDB) Motor  Ankle 3.0  < 6.0 5.0  > 2.5        Bel fib head 10.2 - 4.6 -  Bel fib head-Ankle 31.5 44  > 40   Pop fossa 11.8 - 4.4 -  Pop fossa-Bel fib head 8 50 -   Right Tibial (AH) Motor  Ankle 3.4  < 6.0 11.9  > 4.0        Knee 11.4 - 9.7 -  Knee-Ankle 40 50  > 40    Sensory Sites    Neg Peak Lat Amplitude (O-P) Segment Distance Velocity Comment  Site (ms) Norm (V) Norm  (cm) (ms)   Right Superficial Fibular Sensory  14 cm-Ankle 2.9  < 4.6 8  > 4 14 cm-Ankle 14    Right Sural Sensory  Calf-Lat mall 3.3  < 4.6 5  > 4 Calf-Lat mall 14     H-Reflex Results    M-Lat H  Lat H Neg Amp H-M Lat  Site (ms) (ms) Norm (mV) (ms)  Right Tibial H-Reflex  Pop fossa 5.9 29.4  < 35.0 4.4 23.5   Electromyography   Side Muscle Ins.Act Fibs Fasc Recrt Amp Dur Poly Activation Comment  Right Tib ant Nml Nml Nml Nml Nml Nml Nml Nml N/A  Right Gastroc MH Nml Nml Nml Nml Nml Nml Nml Nml N/A  Right FDL Nml Nml Nml Nml Nml Nml Nml Nml N/A  Right Fib longus Nml Nml Nml Nml Nml Nml Nml Nml N/A  Right Rectus fem Nml Nml Nml Nml Nml Nml Nml Nml N/A  Right Biceps fem SH Nml Nml Nml Nml Nml Nml Nml Nml N/A  Right Lumbar PSP lower Nml Nml Nml Nml Nml Nml Nml Nml N/A      Waveforms:  Motor      Sensory      H-Reflex

## 2024-05-13 ENCOUNTER — Ambulatory Visit: Admitting: Physician Assistant

## 2024-05-15 ENCOUNTER — Encounter: Payer: Self-pay | Admitting: *Deleted

## 2024-05-15 NOTE — Progress Notes (Signed)
 Keith Dorsey                                          MRN: [id number]   05/15/2024   The [facility] Quality Team Specialist reviewed this patient medical record for the purposes of chart review for care gap closure. The following were reviewed: abstraction for care gap closure-glycemic status assessment and kidney health evaluation for diabetes:eGFR  and uACR.    [facility]BCI Quality Team

## 2024-05-19 ENCOUNTER — Ambulatory Visit: Admitting: Physician Assistant

## 2024-05-26 ENCOUNTER — Ambulatory Visit: Admitting: Physician Assistant

## 2024-05-27 ENCOUNTER — Ambulatory Visit: Admitting: Physician Assistant

## 2024-05-27 ENCOUNTER — Encounter: Payer: Self-pay | Admitting: Physician Assistant

## 2024-05-27 VITALS — BP 134/84 | Ht 68.0 in | Wt 182.0 lb

## 2024-05-27 DIAGNOSIS — G8929 Other chronic pain: Secondary | ICD-10-CM

## 2024-05-27 DIAGNOSIS — M4316 Spondylolisthesis, lumbar region: Secondary | ICD-10-CM | POA: Diagnosis not present

## 2024-05-27 DIAGNOSIS — M792 Neuralgia and neuritis, unspecified: Secondary | ICD-10-CM | POA: Diagnosis not present

## 2024-05-27 DIAGNOSIS — R200 Anesthesia of skin: Secondary | ICD-10-CM | POA: Diagnosis not present
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
## 2024-06-09 ENCOUNTER — Encounter: Payer: Self-pay | Admitting: Physician Assistant

## 2024-06-17 ENCOUNTER — Ambulatory Visit
Admission: RE | Admit: 2024-06-17 | Discharge: 2024-06-17 | Disposition: A | Discharge status: Home / Self Care | Source: Ambulatory Visit | Attending: Physician Assistant | Admitting: Physician Assistant

## 2024-06-17 DIAGNOSIS — M545 Low back pain, unspecified: Secondary | ICD-10-CM

## 2024-06-17 DIAGNOSIS — M5116 Intervertebral disc disorders with radiculopathy, lumbar region: Secondary | ICD-10-CM | POA: Diagnosis not present

## 2024-06-17 DIAGNOSIS — M4726 Other spondylosis with radiculopathy, lumbar region: Secondary | ICD-10-CM | POA: Diagnosis not present

## 2024-06-17 DIAGNOSIS — R200 Anesthesia of skin: Secondary | ICD-10-CM

## 2024-06-22 ENCOUNTER — Ambulatory Visit: Payer: Self-pay | Admitting: Physician Assistant

## 2024-07-09 ENCOUNTER — Encounter: Payer: Self-pay | Admitting: Neurosurgery
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
## 2024-07-22 ENCOUNTER — Encounter: Payer: Self-pay | Admitting: Neurosurgery

## 2024-07-22 ENCOUNTER — Ambulatory Visit: Admitting: Neurosurgery

## 2024-07-22 ENCOUNTER — Ambulatory Visit: Payer: Self-pay | Admitting: Neurosurgery

## 2024-07-22 ENCOUNTER — Other Ambulatory Visit: Payer: Self-pay

## 2024-07-22 VITALS — BP 130/82 | Ht 68.0 in | Wt 182.0 lb

## 2024-07-22 DIAGNOSIS — M48062 Spinal stenosis, lumbar region with neurogenic claudication: Secondary | ICD-10-CM

## 2024-07-22 DIAGNOSIS — M4316 Spondylolisthesis, lumbar region: Secondary | ICD-10-CM

## 2024-07-22 DIAGNOSIS — M5417 Radiculopathy, lumbosacral region: Secondary | ICD-10-CM | POA: Insufficient documentation

## 2024-07-22 DIAGNOSIS — M4726 Other spondylosis with radiculopathy, lumbar region: Secondary | ICD-10-CM | POA: Diagnosis not present

## 2024-07-22 DIAGNOSIS — G8929 Other chronic pain: Secondary | ICD-10-CM

## 2024-07-22 DIAGNOSIS — Z01818 Encounter for other preprocedural examination: Secondary | ICD-10-CM

## 2024-07-22 NOTE — Patient Instructions (Signed)
 Please see below for information in regards to your upcoming surgery:   Planned surgery: L3-4 Lateral interbody Fusion(Right), L3-4 Posterior Spinal Fusion    Surgery date: 08/18/24 at [facility] (Medical Mall: [address] [place], [place] [address]) - you will find out your arrival time the business day before your surgery.   Pre-op appointment at [facility] Pre-admit Testing: you will receive a call with a date/time for this appointment. If you are scheduled for an in person appointment, Pre-admit Testing is located on the first floor of the [facility] building, [address], [address]. During this appointment, they will advise you which medications you can take the morning of surgery, and which medications you will need to hold for surgery. Labs (such as blood work, EKG) may be done at your pre-op appointment. You are not required to fast for these labs. Should you need to change your pre-op appointment, please call Pre-admit testing at [phone].     Blood thinners:   Aspirin  81mg :  OK to stay on aspirin  81mg      Diabetes/heart failure/kidney disease/weight loss medications that require an extended hold: Per anesthesia guidelines (due to the increased risk of aspiration caused by delayed gastric emptying):  Empagliflozin (Jardiance): hold for 3 days prior to surgery Metformin: hold for 2 days prior to surgery    Surgical clearance: we will send a clearance form to Dr Wilburn. They may wish to see you in their office prior to signing the clearance form. If so, they may call you to schedule an appointment.    NSAIDS (Non-steroidal anti-inflammatory drugs): because you are having a fusion, please avoid taking any NSAIDS (examples: ibuprofen, motrin, aleve , naproxen , meloxicam, diclofenac) for 3 months after surgery. Celebrex is an exception and is OK to take, if prescribed. Tylenol  is not an NSAID.    Common restrictions after  spine surgery: No bending, lifting, or twisting ("BLT"). Avoid lifting objects heavier than 10 pounds for the first 6 weeks after surgery. Where possible, avoid household activities that involve lifting, bending, reaching, pushing, or pulling such as laundry, vacuuming, grocery shopping, and childcare. Try to arrange for help from friends and family for these activities while you heal. Do not drive while taking prescription pain medication. Weeks 6 through 12 after surgery: avoid lifting more than 25 pounds.    X-rays after surgery: Because you are having a fusion or arthroplasty: for appointments after your 2 week follow-up: please arrive our office 30 minutes prior to your appointment for x-rays. This applies to every appointment after your 2 week follow-up. Failure to do so may result in your appointment being rescheduled.    How to contact us :  If you have any questions/concerns before or after surgery, you can reach us  at [phone], or you can send a mychart message. We can be reached by phone or mychart 8am-4pm, Monday-Friday.  *Please note: Calls after 4pm are forwarded to a third party answering service. Mychart messages are not routinely monitored during evenings, weekends, and holidays. Please call our office to contact the answering service for urgent concerns during non-business hours.     If you have FMLA/disability paperwork, please drop it off or fax it to [phone]   Appointments/FMLA & disability paperwork: Reche Hait, & Nichole Registered Nurse/Surgery scheduler: Melana Hingle, RN & Katie, RN Certified Medical Assistants: Don, CMA, Elenor, CMA, Damien, CMA, & Auston, [place] Physician Assistants: Lyle Decamp, PA-C, Edsel Goods, PA-C & Glade Boys, PA-C Surgeons: Penne Sharps, MD &  Reeves Daisy, MD   [facility] PREADMIT TESTING VISIT and SURGERY INFORMATION SHEET   Now that surgery has been scheduled you can anticipate several phone  calls from [facility] services. A pharmacy technician will call you to verify your current list of medications taken at home.               The Pre-Service Center will call to verify your insurance information and to give you billing estimates and information.             The Preadmit Testing Office will be calling to schedule a visit to obtain information for the anesthesia team and provide instructions on preparation for surgery.  What can you expect for the Preadmit Testing Visit: Appointments may be scheduled in-person or by telephone.  If a telephone visit is scheduled, you may be asked to come into the office to have lab tests or other studies performed.   This visit will not be completed any greater than 14 days prior to your surgery.  If your surgery has been scheduled for a future date, please do not be alarmed if we have not contacted you to schedule an appointment more than a month prior to the surgery date.    Please be prepared to provide the following information during this appointment:            -Personal medical history                                               -Medication and allergy list            -Any history of problems with anesthesia              -Recent lab work or diagnostic studies            -Please notify us  of any needs we should be aware of to provide the best care possible           -You will be provided with instructions on how to prepare for your surgery.    On The Day of Surgery:  You must have a driver to take you home after surgery, you will be asked not to drive for 24 hours following surgery.  Taxi, Gisele and non-medical transport will not be acceptable means of transportation unless you have a responsible individual who will be traveling with you.  Visitors in the surgical area:   2 people will be able to visit you in your room once your preparation for surgery has been completed. During surgery, your visitors will be asked to wait in the Surgery  Waiting Area.  It is not a requirement for them to stay, if they prefer to leave and come back.  Your visitor(s) will be given an update once the surgery has been completed.  No visitors are allowed in the initial recovery room to respect patient privacy and safety.  Once you are more awake and transfer to the secondary recovery area, or are transferred to an inpatient room, visitors will again be able to see you.  To respect and protect your privacy: We will ask on the day of surgery who your driver will be and what the contact number for that individual will be. We will ask if it is okay to share information with this individual, or if there  is an alternative individual that we, or the surgeon, should contact to provide updates and information. If family or friends come to the surgical information desk requesting information about you, who you have not listed with us , no information will be given.   It may be helpful to designate someone as the main contact who will be responsible for updating your other friends and family.    PREADMIT TESTING OFFICE: [phone] SAME DAY SURGERY: [phone] We look forward to caring for you before and throughout the process of your surgery.

## 2024-08-03 DIAGNOSIS — I2510 Atherosclerotic heart disease of native coronary artery without angina pectoris: Secondary | ICD-10-CM | POA: Diagnosis not present

## 2024-08-03 DIAGNOSIS — I10 Essential (primary) hypertension: Secondary | ICD-10-CM | POA: Diagnosis not present

## 2024-08-03 DIAGNOSIS — F1010 Alcohol abuse, uncomplicated: Secondary | ICD-10-CM | POA: Diagnosis not present

## 2024-08-03 DIAGNOSIS — Z1331 Encounter for screening for depression: Secondary | ICD-10-CM | POA: Diagnosis not present

## 2024-08-03 DIAGNOSIS — Z0000 Encounter for general adult medical examination without abnormal findings: Secondary | ICD-10-CM | POA: Diagnosis not present

## 2024-08-03 DIAGNOSIS — F5101 Primary insomnia: Secondary | ICD-10-CM | POA: Diagnosis not present

## 2024-08-03 DIAGNOSIS — E119 Type 2 diabetes mellitus without complications: Secondary | ICD-10-CM | POA: Diagnosis not present

## 2024-08-03 DIAGNOSIS — M51362 Other intervertebral disc degeneration, lumbar region with discogenic back pain and lower extremity pain: Secondary | ICD-10-CM | POA: Diagnosis not present

## 2024-08-03 DIAGNOSIS — Z1330 Encounter for screening examination for mental health and behavioral disorders, unspecified: Secondary | ICD-10-CM | POA: Diagnosis not present

## 2024-08-03 DIAGNOSIS — M47819 Spondylosis without myelopathy or radiculopathy, site unspecified: Secondary | ICD-10-CM | POA: Diagnosis not present

## 2024-08-03 DIAGNOSIS — R82998 Other abnormal findings in urine: Secondary | ICD-10-CM | POA: Diagnosis not present

## 2024-08-03 DIAGNOSIS — F419 Anxiety disorder, unspecified: Secondary | ICD-10-CM | POA: Diagnosis not present

## 2024-08-03 DIAGNOSIS — M4316 Spondylolisthesis, lumbar region: Secondary | ICD-10-CM | POA: Diagnosis not present

## 2024-08-03 DIAGNOSIS — F331 Major depressive disorder, recurrent, moderate: Secondary | ICD-10-CM | POA: Diagnosis not present

## 2024-08-03 DIAGNOSIS — E782 Mixed hyperlipidemia: Secondary | ICD-10-CM | POA: Diagnosis not present

## 2024-08-05 ENCOUNTER — Inpatient Hospital Stay: Admission: RE | Admit: 2024-08-05 | Discharge: 2024-08-05 | Attending: Neurosurgery | Admitting: Neurosurgery

## 2024-08-05 ENCOUNTER — Telehealth: Payer: Self-pay

## 2024-08-05 DIAGNOSIS — M4316 Spondylolisthesis, lumbar region: Secondary | ICD-10-CM | POA: Insufficient documentation

## 2024-08-05 DIAGNOSIS — Z01810 Encounter for preprocedural cardiovascular examination: Secondary | ICD-10-CM

## 2024-08-05 DIAGNOSIS — Z01818 Encounter for other preprocedural examination: Secondary | ICD-10-CM

## 2024-08-05 DIAGNOSIS — R739 Hyperglycemia, unspecified: Secondary | ICD-10-CM

## 2024-08-05 DIAGNOSIS — Z01812 Encounter for preprocedural laboratory examination: Secondary | ICD-10-CM

## 2024-08-05 DIAGNOSIS — E119 Type 2 diabetes mellitus without complications: Secondary | ICD-10-CM | POA: Insufficient documentation

## 2024-08-05 DIAGNOSIS — I10 Essential (primary) hypertension: Secondary | ICD-10-CM | POA: Diagnosis not present

## 2024-08-05 HISTORY — DX: Type 2 diabetes mellitus without complications: E11.9

## 2024-08-05 HISTORY — DX: Male erectile dysfunction, unspecified: N52.9

## 2024-08-05 HISTORY — DX: Hyperlipidemia, unspecified: E78.5

## 2024-08-05 HISTORY — DX: Spondylolisthesis, lumbar region: M43.16

## 2024-08-05 LAB — SURGICAL PCR SCREEN
MRSA, PCR: NEGATIVE
Staphylococcus aureus: POSITIVE — AB

## 2024-08-05 NOTE — Patient Instructions (Addendum)
 Your procedure is scheduled on:08-18-24 Tuesday Report to the Registration Desk on the 1st floor of the [facility].Then proceed to the 2nd floor Surgery Desk To find out your arrival time, please call [phone] between 1PM - 3PM on:08-17-24 Monday If your arrival time is 6:00 am, do not arrive before that time as the [facility] entrance doors do not open until 6:00 am.  REMEMBER: Instructions that are not followed completely may result in serious medical risk, up to and including death; or upon the discretion of your surgeon and anesthesiologist your surgery may need to be rescheduled.  Do not eat food after midnight the night before surgery.  No gum chewing or hard candies.  You may however, drink Water up to 2 hours before you are scheduled to arrive for your surgery. Do not drink anything within 2 hours of your scheduled arrival time.   One week prior to surgery:Last dose will be on 08-10-24 Monday Stop ANY OVER THE COUNTER supplements until after surgery.  Stop empagliflozin (JARDIANCE) 3 days prior to surgery-Last dose will be on 08-14-24 Friday  Stop metFORMIN (GLUCOPHAGE-XR) 2 days prior to surgery-Last dose will be on 08-15-24 Saturday  Continue taking all of your other prescription medications up until the day of surgery.  ON THE DAY OF SURGERY ONLY TAKE THESE MEDICATIONS WITH SIPS OF WATER: -metoprolol tartrate (LOPRESSOR)  -isosorbide mononitrate (IMDUR)   Continue your 81 mg Aspirin  up until the day prior to surgery-Do NOT take the morning of surgery  No Alcohol for 24 hours before or after surgery.  No Smoking including e-cigarettes for 24 hours before surgery.  No chewable tobacco products for at least 6 hours before surgery.  No nicotine patches on the day of surgery.  Do not use any recreational drugs for at least a week (preferably 2 weeks) before your surgery.  Please be advised that the combination of cocaine and anesthesia may have negative outcomes,  up to and including death. If you test positive for cocaine, your surgery will be cancelled.  On the morning of surgery brush your teeth with toothpaste and water, you may rinse your mouth with mouthwash if you wish. Do not swallow any toothpaste or mouthwash.  Use CHG Soap as directed on instruction sheet.  Do not wear jewelry, make-up, hairpins, clips or nail polish.  For welded (permanent) jewelry: bracelets, anklets, waist bands, etc.  Please have this removed prior to surgery.  If it is not removed, there is a chance that hospital personnel will need to cut it off on the day of surgery.  Do not wear lotions, powders, or perfumes.   Do not shave body hair from the neck down 48 hours before surgery.  Contact lenses, hearing aids and dentures may not be worn into surgery.  Do not bring valuables to the hospital. [facility] is not responsible for any missing/lost belongings or valuables.   Notify your doctor if there is any change in your medical condition (cold, fever, infection).  Wear comfortable clothing (specific to your surgery type) to the hospital.  After surgery, you can help prevent lung complications by doing breathing exercises.  Take deep breaths and cough every 1-2 hours. Your doctor may order a device called an Incentive Spirometer to help you take deep breaths. When coughing or sneezing, hold a pillow firmly against your incision with both hands. This is called splinting. Doing this helps protect your incision. It also decreases belly discomfort.  If you are being admitted to  the hospital overnight, leave your suitcase in the car. After surgery it may be brought to your room.  In case of increased patient census, it may be necessary for you, the patient, to continue your postoperative care in the Same Day Surgery department.  If you are being discharged the day of surgery, you will not be allowed to drive home. You will need a responsible individual to drive you  home and stay with you for 24 hours after surgery.   If you are taking public transportation, you will need to have a responsible individual with you.  Please call the Pre-admissions Testing Dept. at [phone] if you have any questions about these instructions.  Surgery Visitation Policy:  Patients having surgery or a procedure may have two visitors.  Children under the age of 36 must have an adult with them who is not the patient.  Inpatient Visitation:    Visiting hours are 7 a.m. to 8 p.m. Up to four visitors are allowed at one time in a patient room. The visitors may rotate out with other people during the day.  One visitor age 72 or older may stay with the patient overnight and must be in the room by 8 p.m.    Pre-operative 4 CHG Bath Instructions   You can play a key role in reducing the risk of infection after surgery. Your skin needs to be as free of germs as possible. You can reduce the number of germs on your skin by washing with CHG (chlorhexidine gluconate) soap before surgery. CHG is an antiseptic soap that kills germs and continues to kill germs even after washing.   DO NOT use if you have an allergy to chlorhexidine/CHG or antibacterial soaps. If your skin becomes reddened or irritated, stop using the CHG and notify one of our RNs at [phone].   Please shower with the CHG soap starting 4 days before surgery using the following schedule:     Please keep in mind the following:  DO NOT shave, including legs and underarms, starting the day of your first shower.   You may shave your face at any point before/day of surgery.  Place clean sheets on your bed the day you start using CHG soap. Use a clean washcloth (not used since being washed) for each shower. DO NOT sleep with pets once you start using the CHG.   CHG Shower Instructions:  If you choose to wash your hair and private area, wash first with your normal shampoo/soap.  After you use shampoo/soap, rinse  your hair and body thoroughly to remove shampoo/soap residue.  Turn the water OFF and apply about 3 tablespoons (45 ml) of CHG soap to a CLEAN washcloth.  Apply CHG soap ONLY FROM YOUR NECK DOWN TO YOUR TOES (washing for 3-5 minutes)  DO NOT use CHG soap on face, private areas, open wounds, or sores.  Pay special attention to the area where your surgery is being performed.  If you are having back surgery, having someone wash your back for you may be helpful. Wait 2 minutes after CHG soap is applied, then you may rinse off the CHG soap.  Pat dry with a clean towel  Put on clean clothes/pajamas   If you choose to wear lotion, please use ONLY the CHG-compatible lotions on the back of this paper.     Additional instructions for the day of surgery: DO NOT APPLY any lotions, deodorants, cologne, or perfumes.   Put on clean/comfortable clothes.  Brush  your teeth.  Ask your nurse before applying any prescription medications to the skin.      CHG Compatible Lotions   Aveeno Moisturizing lotion  Cetaphil Moisturizing Cream  Cetaphil Moisturizing Lotion  Clairol Herbal Essence Moisturizing Lotion, Dry Skin  Clairol Herbal Essence Moisturizing Lotion, Extra Dry Skin  Clairol Herbal Essence Moisturizing Lotion, Normal Skin  Curel Age Defying Therapeutic Moisturizing Lotion with Alpha Hydroxy  Curel Extreme Care Body Lotion  Curel Soothing Hands Moisturizing Hand Lotion  Curel Therapeutic Moisturizing Cream, Fragrance-Free  Curel Therapeutic Moisturizing Lotion, Fragrance-Free  Curel Therapeutic Moisturizing Lotion, Original Formula  Eucerin Daily Replenishing Lotion  Eucerin Dry Skin Therapy Plus Alpha Hydroxy Crme  Eucerin Dry Skin Therapy Plus Alpha Hydroxy Lotion  Eucerin Original Crme  Eucerin Original Lotion  Eucerin Plus Crme Eucerin Plus Lotion  Eucerin TriLipid Replenishing Lotion  Keri Anti-Bacterial Hand Lotion  Keri Deep Conditioning Original Lotion Dry Skin Formula Softly  Scented  Keri Deep Conditioning Original Lotion, Fragrance Free Sensitive Skin Formula  Keri Lotion Fast Absorbing Fragrance Free Sensitive Skin Formula  Keri Lotion Fast Absorbing Softly Scented Dry Skin Formula  Keri Original Lotion  Keri Skin Renewal Lotion Keri Silky Smooth Lotion  Keri Silky Smooth Sensitive Skin Lotion  Nivea Body Creamy Conditioning Oil  Nivea Body Extra Enriched Lotion  Nivea Body Original Lotion  Nivea Body Sheer Moisturizing Lotion Nivea Crme  Nivea Skin Firming Lotion  NutraDerm 30 Skin Lotion  NutraDerm Skin Lotion  NutraDerm Therapeutic Skin Cream  NutraDerm Therapeutic Skin Lotion  ProShield Protective Hand Cream  Provon moisturizing lotion  Pre-operative 4 CHG Bath Instructions   You can play a key role in reducing the risk of infection after surgery. Your skin needs to be as free of germs as possible. You can reduce the number of germs on your skin by washing with CHG (chlorhexidine gluconate) soap before surgery. CHG is an antiseptic soap that kills germs and continues to kill germs even after washing.   DO NOT use if you have an allergy to chlorhexidine/CHG or antibacterial soaps. If your skin becomes reddened or irritated, stop using the CHG and notify one of our RNs at [phone].   Please shower with the CHG soap starting 4 days before surgery using the following schedule:     Please keep in mind the following:  DO NOT shave, including legs and underarms, starting the day of your first shower.   You may shave your face at any point before/day of surgery.  Place clean sheets on your bed the day you start using CHG soap. Use a clean washcloth (not used since being washed) for each shower. DO NOT sleep with pets once you start using the CHG.   CHG Shower Instructions:  If you choose to wash your hair and private area, wash first with your normal shampoo/soap.  After you use shampoo/soap, rinse your hair and body thoroughly to remove  shampoo/soap residue.  Turn the water OFF and apply about 3 tablespoons (45 ml) of CHG soap to a CLEAN washcloth.  Apply CHG soap ONLY FROM YOUR NECK DOWN TO YOUR TOES (washing for 3-5 minutes)  DO NOT use CHG soap on face, private areas, open wounds, or sores.  Pay special attention to the area where your surgery is being performed.  If you are having back surgery, having someone wash your back for you may be helpful. Wait 2 minutes after CHG soap is applied, then you may rinse off the CHG soap.  Keith Dorsey  dry with a clean towel  Put on clean clothes/pajamas   If you choose to wear lotion, please use ONLY the CHG-compatible lotions on the back of this paper.     Additional instructions for the day of surgery: DO NOT APPLY any lotions, deodorants, cologne, or perfumes.   Put on clean/comfortable clothes.  Brush your teeth.  Ask your nurse before applying any prescription medications to the skin.      CHG Compatible Lotions   Aveeno Moisturizing lotion  Cetaphil Moisturizing Cream  Cetaphil Moisturizing Lotion  Clairol Herbal Essence Moisturizing Lotion, Dry Skin  Clairol Herbal Essence Moisturizing Lotion, Extra Dry Skin  Clairol Herbal Essence Moisturizing Lotion, Normal Skin  Curel Age Defying Therapeutic Moisturizing Lotion with Alpha Hydroxy  Curel Extreme Care Body Lotion  Curel Soothing Hands Moisturizing Hand Lotion  Curel Therapeutic Moisturizing Cream, Fragrance-Free  Curel Therapeutic Moisturizing Lotion, Fragrance-Free  Curel Therapeutic Moisturizing Lotion, Original Formula  Eucerin Daily Replenishing Lotion  Eucerin Dry Skin Therapy Plus Alpha Hydroxy Crme  Eucerin Dry Skin Therapy Plus Alpha Hydroxy Lotion  Eucerin Original Crme  Eucerin Original Lotion  Eucerin Plus Crme Eucerin Plus Lotion  Eucerin TriLipid Replenishing Lotion  Keri Anti-Bacterial Hand Lotion  Keri Deep Conditioning Original Lotion Dry Skin Formula Softly Scented  Keri Deep Conditioning  Original Lotion, Fragrance Free Sensitive Skin Formula  Keri Lotion Fast Absorbing Fragrance Free Sensitive Skin Formula  Keri Lotion Fast Absorbing Softly Scented Dry Skin Formula  Keri Original Lotion  Keri Skin Renewal Lotion Keri Silky Smooth Lotion  Keri Silky Smooth Sensitive Skin Lotion  Nivea Body Creamy Conditioning Oil  Nivea Body Extra Enriched Lotion  Nivea Body Original Lotion  Nivea Body Sheer Moisturizing Lotion Nivea Crme  Nivea Skin Firming Lotion  NutraDerm 30 Skin Lotion  NutraDerm Skin Lotion  NutraDerm Therapeutic Skin Cream  NutraDerm Therapeutic Skin Lotion  ProShield Protective Hand Cream  Provon moisturizing lotion  Pre-operative 4 CHG Bath Instructions   You can play a key role in reducing the risk of infection after surgery. Your skin needs to be as free of germs as possible. You can reduce the number of germs on your skin by washing with CHG (chlorhexidine gluconate) soap before surgery. CHG is an antiseptic soap that kills germs and continues to kill germs even after washing.   DO NOT use if you have an allergy to chlorhexidine/CHG or antibacterial soaps. If your skin becomes reddened or irritated, stop using the CHG and notify one of our RNs at [phone].   Please shower with the CHG soap starting 4 days before surgery using the following schedule:     Please keep in mind the following:  DO NOT shave, including legs and underarms, starting the day of your first shower.   You may shave your face at any point before/day of surgery.  Place clean sheets on your bed the day you start using CHG soap. Use a clean washcloth (not used since being washed) for each shower. DO NOT sleep with pets once you start using the CHG.   CHG Shower Instructions:  If you choose to wash your hair and private area, wash first with your normal shampoo/soap.  After you use shampoo/soap, rinse your hair and body thoroughly to remove shampoo/soap residue.  Turn the water  OFF and apply about 3 tablespoons (45 ml) of CHG soap to a CLEAN washcloth.  Apply CHG soap ONLY FROM YOUR NECK DOWN TO YOUR TOES (washing for 3-5 minutes)  DO NOT use  CHG soap on face, private areas, open wounds, or sores.  Pay special attention to the area where your surgery is being performed.  If you are having back surgery, having someone wash your back for you may be helpful. Wait 2 minutes after CHG soap is applied, then you may rinse off the CHG soap.  Pat dry with a clean towel  Put on clean clothes/pajamas   If you choose to wear lotion, please use ONLY the CHG-compatible lotions on the back of this paper.     Additional instructions for the day of surgery: DO NOT APPLY any lotions, deodorants, cologne, or perfumes.   Put on clean/comfortable clothes.  Brush your teeth.  Ask your nurse before applying any prescription medications to the skin.      CHG Compatible Lotions   Aveeno Moisturizing lotion  Cetaphil Moisturizing Cream  Cetaphil Moisturizing Lotion  Clairol Herbal Essence Moisturizing Lotion, Dry Skin  Clairol Herbal Essence Moisturizing Lotion, Extra Dry Skin  Clairol Herbal Essence Moisturizing Lotion, Normal Skin  Curel Age Defying Therapeutic Moisturizing Lotion with Alpha Hydroxy  Curel Extreme Care Body Lotion  Curel Soothing Hands Moisturizing Hand Lotion  Curel Therapeutic Moisturizing Cream, Fragrance-Free  Curel Therapeutic Moisturizing Lotion, Fragrance-Free  Curel Therapeutic Moisturizing Lotion, Original Formula  Eucerin Daily Replenishing Lotion  Eucerin Dry Skin Therapy Plus Alpha Hydroxy Crme  Eucerin Dry Skin Therapy Plus Alpha Hydroxy Lotion  Eucerin Original Crme  Eucerin Original Lotion  Eucerin Plus Crme Eucerin Plus Lotion  Eucerin TriLipid Replenishing Lotion  Keri Anti-Bacterial Hand Lotion  Keri Deep Conditioning Original Lotion Dry Skin Formula Softly Scented  Keri Deep Conditioning Original Lotion, Fragrance Free Sensitive  Skin Formula  Keri Lotion Fast Absorbing Fragrance Free Sensitive Skin Formula  Keri Lotion Fast Absorbing Softly Scented Dry Skin Formula  Keri Original Lotion  Keri Skin Renewal Lotion Keri Silky Smooth Lotion  Keri Silky Smooth Sensitive Skin Lotion  Nivea Body Creamy Conditioning Oil  Nivea Body Extra Enriched Lotion  Nivea Body Original Lotion  Nivea Body Sheer Moisturizing Lotion Nivea Crme  Nivea Skin Firming Lotion  NutraDerm 30 Skin Lotion  NutraDerm Skin Lotion  NutraDerm Therapeutic Skin Cream  NutraDerm Therapeutic Skin Lotion  ProShield Protective Hand Cream  Provon moisturizing lotion  Pre-operative 4 CHG Bath Instructions   You can play a key role in reducing the risk of infection after surgery. Your skin needs to be as free of germs as possible. You can reduce the number of germs on your skin by washing with CHG (chlorhexidine gluconate) soap before surgery. CHG is an antiseptic soap that kills germs and continues to kill germs even after washing.   DO NOT use if you have an allergy to chlorhexidine/CHG or antibacterial soaps. If your skin becomes reddened or irritated, stop using the CHG and notify one of our RNs at [phone].   Please shower with the CHG soap starting 4 days before surgery using the following schedule:     Please keep in mind the following:  DO NOT shave, including legs and underarms, starting the day of your first shower.   You may shave your face at any point before/day of surgery.  Place clean sheets on your bed the day you start using CHG soap. Use a clean washcloth (not used since being washed) for each shower. DO NOT sleep with pets once you start using the CHG.   CHG Shower Instructions:  If you choose to wash your hair and private area, wash first with  your normal shampoo/soap.  After you use shampoo/soap, rinse your hair and body thoroughly to remove shampoo/soap residue.  Turn the water OFF and apply about 3 tablespoons (45 ml) of  CHG soap to a CLEAN washcloth.  Apply CHG soap ONLY FROM YOUR NECK DOWN TO YOUR TOES (washing for 3-5 minutes)  DO NOT use CHG soap on face, private areas, open wounds, or sores.  Pay special attention to the area where your surgery is being performed.  If you are having back surgery, having someone wash your back for you may be helpful. Wait 2 minutes after CHG soap is applied, then you may rinse off the CHG soap.  Pat dry with a clean towel  Put on clean clothes/pajamas   If you choose to wear lotion, please use ONLY the CHG-compatible lotions on the back of this paper.     Additional instructions for the day of surgery: DO NOT APPLY any lotions, deodorants, cologne, or perfumes.   Put on clean/comfortable clothes.  Brush your teeth.  Ask your nurse before applying any prescription medications to the skin.      CHG Compatible Lotions   Aveeno Moisturizing lotion  Cetaphil Moisturizing Cream  Cetaphil Moisturizing Lotion  Clairol Herbal Essence Moisturizing Lotion, Dry Skin  Clairol Herbal Essence Moisturizing Lotion, Extra Dry Skin  Clairol Herbal Essence Moisturizing Lotion, Normal Skin  Curel Age Defying Therapeutic Moisturizing Lotion with Alpha Hydroxy  Curel Extreme Care Body Lotion  Curel Soothing Hands Moisturizing Hand Lotion  Curel Therapeutic Moisturizing Cream, Fragrance-Free  Curel Therapeutic Moisturizing Lotion, Fragrance-Free  Curel Therapeutic Moisturizing Lotion, Original Formula  Eucerin Daily Replenishing Lotion  Eucerin Dry Skin Therapy Plus Alpha Hydroxy Crme  Eucerin Dry Skin Therapy Plus Alpha Hydroxy Lotion  Eucerin Original Crme  Eucerin Original Lotion  Eucerin Plus Crme Eucerin Plus Lotion  Eucerin TriLipid Replenishing Lotion  Keri Anti-Bacterial Hand Lotion  Keri Deep Conditioning Original Lotion Dry Skin Formula Softly Scented  Keri Deep Conditioning Original Lotion, Fragrance Free Sensitive Skin Formula  Keri Lotion Fast Absorbing  Fragrance Free Sensitive Skin Formula  Keri Lotion Fast Absorbing Softly Scented Dry Skin Formula  Keri Original Lotion  Keri Skin Renewal Lotion Keri Silky Smooth Lotion  Keri Silky Smooth Sensitive Skin Lotion  Nivea Body Creamy Conditioning Oil  Nivea Body Extra Enriched Lotion  Nivea Body Original Lotion  Nivea Body Sheer Moisturizing Lotion Nivea Crme  Nivea Skin Firming Lotion  NutraDerm 30 Skin Lotion  NutraDerm Skin Lotion  NutraDerm Therapeutic Skin Cream  NutraDerm Therapeutic Skin Lotion  ProShield Protective Hand Cream  Provon moisturizing lotion  How to Use an Incentive Spirometer An incentive spirometer is a tool that measures how well you are filling your lungs with each breath. Learning to take long, deep breaths using this tool can help you keep your lungs clear and active. This may help to reverse or lessen your chance of developing breathing (pulmonary) problems, especially infection. You may be asked to use a spirometer: After a surgery. If you have a lung problem or a history of smoking. After a long period of time when you have been unable to move or be active. If the spirometer includes an indicator to show the highest number that you have reached, your health care provider or respiratory therapist will help you set a goal. Keep a log of your progress as told by your health care provider. What are the risks? Breathing too quickly may cause dizziness or cause you to pass out. Take  your time so you do not get dizzy or light-headed. If you are in pain, you may need to take pain medicine before doing incentive spirometry. It is harder to take a deep breath if you are having pain. How to use your incentive spirometer  Sit up on the edge of your bed or on a chair. Hold the incentive spirometer so that it is in an upright position. Before you use the spirometer, breathe out normally. Place the mouthpiece in your mouth. Make sure your lips are closed tightly around  it. Breathe in slowly and as deeply as you can through your mouth, causing the piston or the ball to rise toward the top of the chamber. Hold your breath for 3-5 seconds, or for as long as possible. If the spirometer includes a coach indicator, use this to guide you in breathing. Slow down your breathing if the indicator goes above the marked areas. Remove the mouthpiece from your mouth and breathe out normally. The piston or ball will return to the bottom of the chamber. Rest for a few seconds, then repeat the steps 10 or more times. Take your time and take a few normal breaths between deep breaths so that you do not get dizzy or light-headed. Do this every 1-2 hours when you are awake. If the spirometer includes a goal marker to show the highest number you have reached (best effort), use this as a goal to work toward during each repetition. After each set of 10 deep breaths, cough a few times. This will help to make sure that your lungs are clear. If you have an incision on your chest or abdomen from surgery, place a pillow or a rolled-up towel firmly against the incision when you cough. This can help to reduce pain while taking deep breaths and coughing. General tips When you are able to get out of bed: Walk around often. Continue to take deep breaths and cough in order to clear your lungs. Keep using the incentive spirometer until your health care provider says it is okay to stop using it. If you have been in the hospital, you may be told to keep using the spirometer at home. Contact a health care provider if: You are having difficulty using the spirometer. You have trouble using the spirometer as often as instructed. Your pain medicine is not giving enough relief for you to use the spirometer as told. You have a fever. Get help right away if: You develop shortness of breath. You develop a cough with bloody mucus from the lungs. You have fluid or blood coming from an incision site after  you cough. Summary An incentive spirometer is a tool that can help you learn to take long, deep breaths to keep your lungs clear and active. You may be asked to use a spirometer after a surgery, if you have a lung problem or a history of smoking, or if you have been inactive for a long period of time. Use your incentive spirometer as instructed every 1-2 hours while you are awake. If you have an incision on your chest or abdomen, place a pillow or a rolled-up towel firmly against your incision when you cough. This will help to reduce pain. Get help right away if you have shortness of breath, you cough up bloody mucus, or blood comes from your incision when you cough. This information is not intended to replace advice given to you by your health care provider. Make sure you discuss any questions you have  with your health care provider. Document Revised: 06/14/2023 Document Reviewed: 06/14/2023 Elsevier Patient Education  2024 [employer].   [employer] Directory to address health-related social needs:  [email or web address]

## 2024-08-05 NOTE — Progress Notes (Addendum)
 Pt came in for anesthesia interview for upcoming surgery with Dr Claudene on 08-18-24. Upon reviewing labs, A1C done on 08-03-24 was 8.1. [name] NP reached out to Dr Vernon office. Dr Claudene states surgery will have to be rescheduled as they do not do lumbar surgeries with an A1C >7.5. Pt notified of this and I gave him the option to complete interview today and come back once surgery is rescheduled to have his labs done (Fructosamine) or do it all at once. Pt states he wants to come back once he gets rescheduled and do it all at once. MRSA swab was done today as that should not have to be redone once surgery is rescheduled

## 2024-08-05 NOTE — Telephone Encounter (Signed)
 Per discussion with Dr Claudene, surgery has been rescheduled to 09/22/24 due to elevated A1c. Will plan for fructosamine test around 09/07/24. Discussed with [name], NP - he will plan for this to be done at the PAT appointment.  I have spoken with the patient about the change via mychart.

## 2024-08-18 DIAGNOSIS — G8929 Other chronic pain: Secondary | ICD-10-CM | POA: Insufficient documentation

## 2024-08-18 DIAGNOSIS — M5417 Radiculopathy, lumbosacral region: Secondary | ICD-10-CM | POA: Insufficient documentation

## 2024-08-18 DIAGNOSIS — M4316 Spondylolisthesis, lumbar region: Secondary | ICD-10-CM

## 2024-08-24 ENCOUNTER — Encounter: Payer: Self-pay | Admitting: Neurosurgery

## 2024-09-02 ENCOUNTER — Encounter: Admitting: Physician Assistant

## 2024-09-09 ENCOUNTER — Other Ambulatory Visit: Payer: Self-pay

## 2024-09-09 ENCOUNTER — Encounter
Admission: RE | Admit: 2024-09-09 | Discharge: 2024-09-09 | Disposition: A | Discharge status: Home / Self Care | Source: Ambulatory Visit | Attending: Neurosurgery | Admitting: Neurosurgery

## 2024-09-09 VITALS — BP 103/71 | HR 51 | Resp 14 | Ht 68.0 in | Wt 181.0 lb

## 2024-09-09 DIAGNOSIS — M4316 Spondylolisthesis, lumbar region: Secondary | ICD-10-CM | POA: Insufficient documentation

## 2024-09-09 DIAGNOSIS — E119 Type 2 diabetes mellitus without complications: Secondary | ICD-10-CM | POA: Diagnosis not present

## 2024-09-09 DIAGNOSIS — Z01818 Encounter for other preprocedural examination: Secondary | ICD-10-CM | POA: Insufficient documentation

## 2024-09-09 DIAGNOSIS — R7309 Other abnormal glucose: Secondary | ICD-10-CM

## 2024-09-09 DIAGNOSIS — Z01812 Encounter for preprocedural laboratory examination: Secondary | ICD-10-CM

## 2024-09-09 HISTORY — DX: Personal history of other infectious and parasitic diseases: Z86.19

## 2024-09-09 HISTORY — DX: Anxiety disorder, unspecified: F41.9

## 2024-09-09 HISTORY — DX: Insomnia, unspecified: G47.00

## 2024-09-09 HISTORY — DX: Atherosclerotic heart disease of native coronary artery without angina pectoris: I25.10

## 2024-09-09 HISTORY — DX: Other intervertebral disc degeneration, lumbar region without mention of lumbar back pain or lower extremity pain: M51.369

## 2024-09-09 HISTORY — DX: Acquired absence of other specified parts of digestive tract: Z90.49

## 2024-09-09 LAB — URINALYSIS, COMPLETE (UACMP) WITH MICROSCOPIC
Bacteria, UA: NONE SEEN
Bilirubin Urine: NEGATIVE
Glucose, UA: >=500 mg/dL — AB
Hgb urine dipstick: NEGATIVE
Ketones, ur: NEGATIVE mg/dL
Leukocytes,Ua: NEGATIVE
Nitrite: NEGATIVE
Protein, ur: NEGATIVE mg/dL
Specific Gravity, Urine: 1.026 (ref 1.005–1.030)
Squamous Epithelial / HPF: 0 /HPF (ref 0–5)
pH: 5 (ref 5.0–8.0)

## 2024-09-09 LAB — TYPE AND SCREEN
ABO/RH(D): O POS
Antibody Screen: NEGATIVE

## 2024-09-09 NOTE — Patient Instructions (Addendum)
 Your procedure is scheduled on:09-22-24 Tuesday Report to the Registration Desk on the 1st floor of the [facility].Then proceed to the 2nd floor Surgery Desk To find out your arrival time, please call [phone] between 1PM - 3PM on:09-21-24 Monday If your arrival time is 6:00 am, do not arrive before that time as the [facility] entrance doors do not open until 6:00 am.  REMEMBER: Instructions that are not followed completely may result in serious medical risk, up to and including death; or upon the discretion of your surgeon and anesthesiologist your surgery may need to be rescheduled.  Do not eat food after midnight the night before surgery.  No gum chewing or hard candies.  You may however, drink Water up to 2 hours before you are scheduled to arrive for your surgery. Do not drink anything within 2 hours of your scheduled arrival time.  One week prior to surgery:Last dose will be on 09-14-24 Monday Stop ANY OVER THE COUNTER supplements until after surgery.  Stop empagliflozin (JARDIANCE) 3 days prior to surgery-Last dose will be on 09-18-24 Friday  Stop metFORMIN (GLUCOPHAGE-XR) 2 days prior to surgery-Last dose will be on 09-19-24 Saturday  Continue taking all of your other prescription medications up until the day of surgery.  ON THE DAY OF SURGERY ONLY TAKE THESE MEDICATIONS WITH SIPS OF WATER: -isosorbide mononitrate (IMDUR)  -metoprolol tartrate (LOPRESSOR)   Continue your 81 mg Aspirin  up until the day prior to surgery-Do NOT take the day of surgery  No Alcohol for 24 hours before or after surgery.  No Smoking including e-cigarettes for 24 hours before surgery.  No chewable tobacco products for at least 6 hours before surgery.  No nicotine patches on the day of surgery.  Do not use any recreational drugs for at least a week (preferably 2 weeks) before your surgery.  Please be advised that the combination of cocaine and anesthesia may have negative outcomes, up to and  including death. If you test positive for cocaine, your surgery will be cancelled.  On the morning of surgery brush your teeth with toothpaste and water, you may rinse your mouth with mouthwash if you wish. Do not swallow any toothpaste or mouthwash.  Use CHG Soap as directed on instruction sheet.  Do not wear jewelry, make-up, hairpins, clips or nail polish.  For welded (permanent) jewelry: bracelets, anklets, waist bands, etc.  Please have this removed prior to surgery.  If it is not removed, there is a chance that hospital personnel will need to cut it off on the day of surgery.  Do not wear lotions, powders, or perfumes.   Do not shave body hair from the neck down 48 hours before surgery.  Contact lenses, hearing aids and dentures may not be worn into surgery.  Do not bring valuables to the hospital. [facility] is not responsible for any missing/lost belongings or valuables.   Notify your doctor if there is any change in your medical condition (cold, fever, infection).  Wear comfortable clothing (specific to your surgery type) to the hospital.  After surgery, you can help prevent lung complications by doing breathing exercises.  Take deep breaths and cough every 1-2 hours. Your doctor may order a device called an Incentive Spirometer to help you take deep breaths. When coughing or sneezing, hold a pillow firmly against your incision with both hands. This is called splinting. Doing this helps protect your incision. It also decreases belly discomfort.  If you are being admitted to the  hospital overnight, leave your suitcase in the car. After surgery it may be brought to your room.  In case of increased patient census, it may be necessary for you, the patient, to continue your postoperative care in the Same Day Surgery department.  If you are being discharged the day of surgery, you will not be allowed to drive home. You will need a responsible individual to drive you home and  stay with you for 24 hours after surgery.   If you are taking public transportation, you will need to have a responsible individual with you.  Please call the Pre-admissions Testing Dept. at [phone] if you have any questions about these instructions.  Surgery Visitation Policy:  Patients having surgery or a procedure may have two visitors.  Children under the age of 68 must have an adult with them who is not the patient.  Inpatient Visitation:    Visiting hours are 7 a.m. to 8 p.m. Up to four visitors are allowed at one time in a patient room. The visitors may rotate out with other people during the day.  One visitor age 83 or older may stay with the patient overnight and must be in the room by 8 p.m.    Pre-operative 4 CHG Bath Instructions   You can play a key role in reducing the risk of infection after surgery. Your skin needs to be as free of germs as possible. You can reduce the number of germs on your skin by washing with CHG (chlorhexidine gluconate) soap before surgery. CHG is an antiseptic soap that kills germs and continues to kill germs even after washing.   DO NOT use if you have an allergy to chlorhexidine/CHG or antibacterial soaps. If your skin becomes reddened or irritated, stop using the CHG and notify one of our RNs at [phone].   Please shower with the CHG soap starting 4 days before surgery using the following schedule:     Please keep in mind the following:  DO NOT shave, including legs and underarms, starting the day of your first shower.   You may shave your face at any point before/day of surgery.  Place clean sheets on your bed the day you start using CHG soap. Use a clean washcloth (not used since being washed) for each shower. DO NOT sleep with pets once you start using the CHG.   CHG Shower Instructions:  If you choose to wash your hair and private area, wash first with your normal shampoo/soap.  After you use shampoo/soap, rinse your hair  and body thoroughly to remove shampoo/soap residue.  Turn the water OFF and apply about 3 tablespoons (45 ml) of CHG soap to a CLEAN washcloth.  Apply CHG soap ONLY FROM YOUR NECK DOWN TO YOUR TOES (washing for 3-5 minutes)  DO NOT use CHG soap on face, private areas, open wounds, or sores.  Pay special attention to the area where your surgery is being performed.  If you are having back surgery, having someone wash your back for you may be helpful. Wait 2 minutes after CHG soap is applied, then you may rinse off the CHG soap.  Pat dry with a clean towel  Put on clean clothes/pajamas   If you choose to wear lotion, please use ONLY the CHG-compatible lotions on the back of this paper.     Additional instructions for the day of surgery: DO NOT APPLY any lotions, deodorants, cologne, or perfumes.   Put on clean/comfortable clothes.  Brush your  teeth.  Ask your nurse before applying any prescription medications to the skin.      CHG Compatible Lotions   Aveeno Moisturizing lotion  Cetaphil Moisturizing Cream  Cetaphil Moisturizing Lotion  Clairol Herbal Essence Moisturizing Lotion, Dry Skin  Clairol Herbal Essence Moisturizing Lotion, Extra Dry Skin  Clairol Herbal Essence Moisturizing Lotion, Normal Skin  Curel Age Defying Therapeutic Moisturizing Lotion with Alpha Hydroxy  Curel Extreme Care Body Lotion  Curel Soothing Hands Moisturizing Hand Lotion  Curel Therapeutic Moisturizing Cream, Fragrance-Free  Curel Therapeutic Moisturizing Lotion, Fragrance-Free  Curel Therapeutic Moisturizing Lotion, Original Formula  Eucerin Daily Replenishing Lotion  Eucerin Dry Skin Therapy Plus Alpha Hydroxy Crme  Eucerin Dry Skin Therapy Plus Alpha Hydroxy Lotion  Eucerin Original Crme  Eucerin Original Lotion  Eucerin Plus Crme Eucerin Plus Lotion  Eucerin TriLipid Replenishing Lotion  Keri Anti-Bacterial Hand Lotion  Keri Deep Conditioning Original Lotion Dry Skin Formula Softly Scented   Keri Deep Conditioning Original Lotion, Fragrance Free Sensitive Skin Formula  Keri Lotion Fast Absorbing Fragrance Free Sensitive Skin Formula  Keri Lotion Fast Absorbing Softly Scented Dry Skin Formula  Keri Original Lotion  Keri Skin Renewal Lotion Keri Silky Smooth Lotion  Keri Silky Smooth Sensitive Skin Lotion  Nivea Body Creamy Conditioning Oil  Nivea Body Extra Enriched Lotion  Nivea Body Original Lotion  Nivea Body Sheer Moisturizing Lotion Nivea Crme  Nivea Skin Firming Lotion  NutraDerm 30 Skin Lotion  NutraDerm Skin Lotion  NutraDerm Therapeutic Skin Cream  NutraDerm Therapeutic Skin Lotion  ProShield Protective Hand Cream  Provon moisturizing lotion  How to Use an Incentive Spirometer An incentive spirometer is a tool that measures how well you are filling your lungs with each breath. Learning to take long, deep breaths using this tool can help you keep your lungs clear and active. This may help to reverse or lessen your chance of developing breathing (pulmonary) problems, especially infection. You may be asked to use a spirometer: After a surgery. If you have a lung problem or a history of smoking. After a long period of time when you have been unable to move or be active. If the spirometer includes an indicator to show the highest number that you have reached, your health care provider or respiratory therapist will help you set a goal. Keep a log of your progress as told by your health care provider. What are the risks? Breathing too quickly may cause dizziness or cause you to pass out. Take your time so you do not get dizzy or light-headed. If you are in pain, you may need to take pain medicine before doing incentive spirometry. It is harder to take a deep breath if you are having pain. How to use your incentive spirometer  Sit up on the edge of your bed or on a chair. Hold the incentive spirometer so that it is in an upright position. Before you use the spirometer,  breathe out normally. Place the mouthpiece in your mouth. Make sure your lips are closed tightly around it. Breathe in slowly and as deeply as you can through your mouth, causing the piston or the ball to rise toward the top of the chamber. Hold your breath for 3-5 seconds, or for as long as possible. If the spirometer includes a coach indicator, use this to guide you in breathing. Slow down your breathing if the indicator goes above the marked areas. Remove the mouthpiece from your mouth and breathe out normally. The piston or ball will return to  the bottom of the chamber. Rest for a few seconds, then repeat the steps 10 or more times. Take your time and take a few normal breaths between deep breaths so that you do not get dizzy or light-headed. Do this every 1-2 hours when you are awake. If the spirometer includes a goal marker to show the highest number you have reached (best effort), use this as a goal to work toward during each repetition. After each set of 10 deep breaths, cough a few times. This will help to make sure that your lungs are clear. If you have an incision on your chest or abdomen from surgery, place a pillow or a rolled-up towel firmly against the incision when you cough. This can help to reduce pain while taking deep breaths and coughing. General tips When you are able to get out of bed: Walk around often. Continue to take deep breaths and cough in order to clear your lungs. Keep using the incentive spirometer until your health care provider says it is okay to stop using it. If you have been in the hospital, you may be told to keep using the spirometer at home. Contact a health care provider if: You are having difficulty using the spirometer. You have trouble using the spirometer as often as instructed. Your pain medicine is not giving enough relief for you to use the spirometer as told. You have a fever. Get help right away if: You develop shortness of breath. You  develop a cough with bloody mucus from the lungs. You have fluid or blood coming from an incision site after you cough. Summary An incentive spirometer is a tool that can help you learn to take long, deep breaths to keep your lungs clear and active. You may be asked to use a spirometer after a surgery, if you have a lung problem or a history of smoking, or if you have been inactive for a long period of time. Use your incentive spirometer as instructed every 1-2 hours while you are awake. If you have an incision on your chest or abdomen, place a pillow or a rolled-up towel firmly against your incision when you cough. This will help to reduce pain. Get help right away if you have shortness of breath, you cough up bloody mucus, or blood comes from your incision when you cough. This information is not intended to replace advice given to you by your health care provider. Make sure you discuss any questions you have with your health care provider. Document Revised: 06/14/2023 Document Reviewed: 06/14/2023 Elsevier Patient Education  2024 [employer].   [employer] Directory to address health-related social needs:  [email or web address]

## 2024-09-10 ENCOUNTER — Ambulatory Visit: Payer: Self-pay | Admitting: Urgent Care

## 2024-09-10 LAB — FRUCTOSAMINE: Fructosamine: 297 umol/L — ABNORMAL HIGH (ref 0–285)

## 2024-09-10 NOTE — Progress Notes (Signed)
" °  Perioperative Services Pre-Admission/Anesthesia Testing    Date: 09/10/24  Name: Keith Dorsey DOB: [birth year] MRN:   [id number]  Re:   Planned Surgical Procedure(s):     Case: [id number] Date/Time: 09/22/24 0953   Procedures:      L3-4 Lateral interbody Fusion(Right), L3-4 Posterior Spional Fusion     CT Navigation   Anesthesia type: General   Diagnosis:      Chronic right-sided low back pain, unspecified whether sciatica present [M54.50, G89.29]     Spondylolisthesis at L3-L4 level [M43.16]     Lumbosacral radiculopathy at L3 [M54.17]     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-sided sciatica [G89.29, M54.41]   Pre-op diagnosis: Lumbar Spondylolisthesis, L3/4 Severe Radiculopathy   Location: [facility] OR ROOM 03 / [facility]   Surgeons: Claudene Penne ORN, MD        Clinical Notes:  Patient is scheduled for the above procedure on 09/22/2024 with Dr. Penne ORN Claudene, MD.  Procedure previously rescheduled for an elevated hemoglobin A1c of 8.1%.  Patient presented to the PAT clinic on the morning of 09/09/2024 for repeat fructosamine level in order to determine current glycemic control ahead of rescheduled elective neurosurgical procedure.  Based on standard formula of: (0.016 x 297) + 1.61, patient's Fructosamine result of  297 mol/L would correlates with an estimated hemoglobin A1c of approximately 6.4%  Given requirements by the neurosurgical service line, patient should be okay to proceed with planned surgical procedure at this point.  With that said, we will forward results over to attending surgeons office for review.  [name], MSN, APRN, FNP-C, CEN [facility]  Perioperative Services Nurse Practitioner Phone: [phone] Fax: [phone] 09/10/24 8:00 AM  NOTE: This note has been prepared using Dragon dictation software. Despite my best ability to proofread, there is always the potential that unintentional  transcriptional errors may still occur from this process.  "

## 2024-09-10 NOTE — Telephone Encounter (Signed)
 Fructosamine=297. Per Dorise with PAT, ok to proceed with surgery

## 2024-09-16 ENCOUNTER — Encounter: Payer: Self-pay | Admitting: Neurosurgery

## 2024-09-16 NOTE — Progress Notes (Signed)
 " Perioperative / Anesthesia Services  Pre-Admission Testing Clinical Review / Pre-Operative Anesthesia Consult  Date: 09/21/24  PATIENT DEMOGRAPHICS: Name: Keith Dorsey DOB: 11-19-1965 MRN:   [id number]  Note: Available PAT nursing documentation and vital signs have been reviewed. Clinical nursing staff has updated patient's PMH/PSHx, current medication list, and drug allergies/intolerances to ensure complete and comprehensive history available to assist care teams in MDM as it pertains to the aforementioned surgical procedure and anticipated anesthetic course. Extensive review of available clinical information personally performed. Nursing documentation reviewed. [place] PMH and PSHx updated with any diagnoses and/or procedures that I have knowledge of that may have been inadvertently omitted during his intake with the pre-admission testing department's nursing staff.  PLANNED SURGICAL PROCEDURE(S):   Case: [id number] Date/Time: 09/22/24 0953   Procedures:      L3-4 Lateral interbody Fusion(Right), L3-4 Posterior Spional Fusion     CT Navigation   Anesthesia type: General   Diagnosis:      Chronic right-sided low back pain, unspecified whether sciatica present [M54.50, G89.29]     Spondylolisthesis at L3-L4 level [M43.16]     Lumbosacral radiculopathy at L3 [M54.17]     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-sided sciatica [G89.29, M54.41]   Pre-op diagnosis: Lumbar Spondylolisthesis, L3/4 Severe Radiculopathy   Location: [facility] OR ROOM 03 / [facility]   Surgeons: Claudene Penne ORN, MD        CLINICAL DISCUSSION: Keith Dorsey is a 59 y.o. male who is submitted for pre-surgical anesthesia review and clearance prior to him undergoing the above procedure. Patient has never been a smoker in the past. Pertinent PMH includes: CAD, precordial pain, IRBBB, HTN, HLD, T2DM, remote colon cancer (s/p partial colectomy), OA, lumbar DDD, anxiety, depression,  insomnia.  Patient is followed by cardiology Jodeen, MD). He was last seen in the cardiology clinic on 08/31/2024; notes reviewed. At the time of his clinic visit, patient doing well overall from a cardiovascular perspective. Patient denied any chest pain, shortness of breath, PND, orthopnea, palpitations, significant peripheral edema, weakness, fatigue, vertiginous symptoms, or presyncope/syncope. Patient with a past medical history significant for cardiovascular diagnoses. Documented physical exam was grossly benign, providing no evidence of acute exacerbation and/or decompensation of the patient's known cardiovascular conditions.  Most recent TTE performed on 05/13/2023 revealed a normal left ventricular systolic function with an EF of 55%. There was no significant LVH.  There were no regional wall motion abnormalities. Left ventricular diastolic Doppler parameters consistent with abnormal relaxation (G1DD). GLS -18% (normal range <-18%). Right ventricular size and function normal with a TAPSE measuring 2.1 cm  (normal range >/= 1.6 cm).  There was mild mitral, trivial pulmonic, and trivial tricuspid valve regurgitation.  All transvalvular gradients were noted to be normal providing no evidence of hemodynamically significant valvular stenosis. Aorta normal in size with no evidence of ectasia or aneurysmal dilatation.  Most recent myocardial perfusion imaging study was performed on 05/13/2023 revealing a low normal left ventricular systolic function with an EF of 50%.  There were no regional wall motion abnormalities.  No artifact or left ventricular cavity size enlargement appreciated on review of imaging. SPECT images demonstrated a small reversible perfusion abnormality of mild intensity along the apical lateral and mid inferior lateral wall.  TID ratio = 1.34 (normal range </= 1.2).  Study determined to be at least intermediate risk.  Patient underwent diagnostic LEFT heart catheterization on  05/21/2023 revealing normal left ventricular systolic function with an EF  of 55 to 65%.  LVEDP was normal.  CAD noted with 50% lesion in the proximal and mid LAD.  Given the nonobstructive nature of his coronary artery disease, the decision was made to defer intervention at that time opting for medical management.  Patient with ongoing anginal symptoms.  Repeat diagnostic LEFT heart catheterization was performed on 08/27/2023 revealing 50% stenosis of the mid LAD and 30% stenosis of the ostial-proximal LAD.  RFR was negative at 0.95.  Again, given the nonobstructive nature of his coronary artery disease, interventional cardiology made the decision to defer intervention opting for aggressive secondary prevention through medical management.  Blood pressure well controlled at 120/60 mmHg on currently prescribed nitrate (isosorbide  mononitrate), ACEi (lisinopril ), and beta-blocker (metoprolol  tartrate) therapies.  Patient statin intolerant and currently not taking anything for his HLD diagnosis and further ASCVD prevention.  T2DM poorly controlled on currently prescribed regimen; last HgbA1c was 8.1% when checked on 08/03/2024.  Of note, fructosamine level was obtained on 09/09/2024 and was found to be elevated at 297, which corresponds to an approximate A1c of 6.4%.  Patient does not have an OSAH diagnosis. Patient is able to complete all of his  ADL/IADLs without cardiovascular limitation.  Per the [facility], patient is able to achieve at least 4 METS of physical activity without experiencing any significant degree of angina/anginal equivalent symptoms. No changes were made to his medication regimen during his visit with cardiology.  Patient scheduled to follow-up with outpatient cardiology in 6 months or sooner if needed.  Keith Dorsey is scheduled for an elective L3-4 LATERAL INTERBODY FUSION(RIGHT), L3-4 POSTERIOR SPINAL FUSION; CT NAVIGATION on 09/22/2024 with Dr. Penne LELON Sharps, MD. Given patient's past  medical history significant for cardiovascular diagnoses, presurgical cardiac clearance was sought by the PAT team. Per cardiology, regarding preop risk stratification, he will be at INTERMEDIATE risk for spinal surgery. No further evaluation is indicated at this time and he is optimized from cardiac standpoint. Continue current medications.  In review of the patient's medication reconciliation, it is noted that he is on daily oral antithrombotic therapy. Given that patient's past medical history is significant for cardiovascular diagnoses, including but not limited to CAD, neurosurgery has cleared patient to continue his daily low dose ASA throughout his perioperative course.  Patient has been updated on these directives from his specialty care providers by the PAT team.  Patient denies previous perioperative complications with anesthesia in the past. In review his EMR, there are no records available for review pertaining to any anesthetic courses within the [facility] in the recent past.   MOST RECENT VITAL SIGNS:    09/09/2024    9:55 AM 07/22/2024    9:38 AM 05/27/2024    2:01 PM  Vitals with BMI  Height 5' 8 5' 8 5' 8  Weight 181 lbs 182 lbs 182 lbs  BMI 27.53 27.68 27.68  Systolic 103 130 865  Diastolic 71 82 84  Pulse 51     PROVIDERS/SPECIALISTS: NOTE: Primary physician provider listed below. Patient may have been seen by APP or partner within same practice.   PROVIDER ROLE / SPECIALTY LAST OV  Sharps Penne LELON, MD Neurosurgery (Surgeon) 07/22/2024  Delfina Pao, MD Primary Care Provider 08/03/2024  Wilburn Fillers, MD Cardiology 08/31/2024   ALLERGIES: [place], Diphenhydramine -acetaminophen , and Other  CURRENT HOME MEDICATIONS:  aspirin  EC 81 MG tablet   empagliflozin  (JARDIANCE ) 10 MG TABS tablet   glipiZIDE  (GLUCOTROL  XL) 10 MG 24 hr tablet   isosorbide  mononitrate (  IMDUR ) 60 MG 24 hr tablet   lisinopril  (ZESTRIL ) 20 MG tablet   metFORMIN  (GLUCOPHAGE -XR) 500  MG 24 hr tablet   metoprolol  tartrate (LOPRESSOR ) 25 MG tablet   traZODone  (DESYREL ) 50 MG tablet   HISTORY: Past Medical History:  Diagnosis Date   Anxiety    CAD (coronary artery disease)    Colon cancer (HCC) 2001   a.) s/p partial colectomy   DDD (degenerative disc disease), lumbar    Depression    DM (diabetes mellitus), type 2 (HCC)    ED (erectile dysfunction)    Hx of [place] spotted fever    Hyperlipidemia    Hypertension    Incomplete RBBB    Insomnia    a.) takes trazodone  PRN   Osteoarthritis of spine with radiculopathy, lumbar region    Precordial pain    Spondylolisthesis at L3-L4 level    Statin intolerance    Past Surgical History:  Procedure Laterality Date   COLON SURGERY  2001   due to colon cancer s/p resection   CORONARY PRESSURE/FFR STUDY N/A 08/27/2023   Procedure: CORONARY PRESSURE/FFR STUDY;  Surgeon: Ammon Blunt, MD;  Location: [facility] INVASIVE CV LAB;  Service: Cardiovascular;  Laterality: N/A;   CORONARY STENT INTERVENTION N/A 08/27/2023   Procedure: CORONARY STENT INTERVENTION;  Surgeon: Ammon Blunt, MD;  Location: [facility] INVASIVE CV LAB;  Service: Cardiovascular;  Laterality: N/A;   LEFT HEART CATH AND CORONARY ANGIOGRAPHY Left 05/21/2023   Procedure: LEFT HEART CATH AND CORONARY ANGIOGRAPHY;  Surgeon: Florencio Cara BIRCH, MD;  Location: [facility] INVASIVE CV LAB;  Service: Cardiovascular;  Laterality: Left;   No family history on file. Social History   Tobacco Use   Smoking status: Never   Smokeless tobacco: Never  Substance Use Topics   Alcohol use: Yes    Alcohol/week: 24.0 standard drinks of alcohol    Types: 24 Cans of beer per week    Comment: light beer   LABS:  Hospital Outpatient Visit on 09/09/2024  Component Date Value Ref Range Status   ABO/RH(D) 09/09/2024 O POS   Final   Antibody Screen 09/09/2024 NEG   Final   Sample Expiration 09/09/2024 09/23/2024,2359   Final   Extend sample reason 09/09/2024     Final                   Value:NO TRANSFUSIONS OR PREGNANCY IN THE PAST 3 MONTHS Performed at [facility], [address] [place], [place] [address]    Fructosamine 09/09/2024 297 (H)  0 - 285 umol/L Final   Comment: (NOTE) Published reference interval for apparently healthy subjects between age 6 and 30 is 68 - 285 umol/L and in a poorly controlled diabetic population is 228 - 563 umol/L with a mean of 396 umol/L. Performed At: [facility] [address] [place], [place] [id number] Jennette Shorter MD Ey:[id number]    Color, Urine 09/09/2024 YELLOW (A)  YELLOW Final   APPearance 09/09/2024 CLEAR (A)  CLEAR Final   Specific Gravity, Urine 09/09/2024 1.026  1.005 - 1.030 Final   pH 09/09/2024 5.0  5.0 - 8.0 Final   Glucose, UA 09/09/2024 >=500 (A)  NEGATIVE mg/dL Final   Hgb urine dipstick 09/09/2024 NEGATIVE  NEGATIVE Final   Bilirubin Urine 09/09/2024 NEGATIVE  NEGATIVE Final   Ketones, ur 09/09/2024 NEGATIVE  NEGATIVE mg/dL Final   Protein, ur 98/78/7973 NEGATIVE  NEGATIVE mg/dL Final   Nitrite 98/78/7973 NEGATIVE  NEGATIVE Final   Leukocytes,Ua 09/09/2024 NEGATIVE  NEGATIVE  Final   RBC / HPF 09/09/2024 0-5  0 - 5 RBC/hpf Final   WBC, UA 09/09/2024 0-5  0 - 5 WBC/hpf Final   Bacteria, UA 09/09/2024 NONE SEEN  NONE SEEN Final   Squamous Epithelial / HPF 09/09/2024 0  0 - 5 /HPF Final   Performed at [facility], [address] [place], [place] [address]    ECG: Date: 08/31/2024  Time ECG obtained: 1606 PM Rate: 64 bpm Rhythm: normal sinus; IRBBB Axis (leads I and aVF): left Intervals: PR 176 ms. QRS 96 ms. QTc 418 ms. ST segment and T wave changes: No evidence of acute T wave abnormalities or significant ST segment elevation or depression.  Evidence of a possible, age undetermined, prior infarct:  No Comparison: Similar to previous tracing obtained on 03/03/2024   IMAGING / PROCEDURES: MR LUMBAR SPINE WO CONTRAST performed on  06/17/2024 Worsened prominent bilateral foraminal stenosis at L3-4 due to disc uncovering, disc bulge, intervertebral spurring, and facet arthropathy. 3 mm anterolisthesis at L3-4 related to chronic bilateral L3 pars defects. 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 ADDENDUM: For clarification purposes, when I refer to the bilateral foraminal impingement at L3-4 as prominent, it is synonymous with SEVERE. In other words, there is worsened severe bilateral foraminal stenosis at L3-4.  LEFT HEART CATHETERIZATION AND CORONARY ANGIOGRAPHY performed on 08/27/2023 Nonobstructive coronary artery disease proximal and mid LAD Mid LAD lesion is 50% stenosed. Ost LAD to Prox LAD lesion is 30% stenosed. Negative RFR (0.95) Recommendations Defer PCI Continue aggressive risk factor modification   MYOCARDIAL PERFUSION IMAGING STUDY (LEXISCAN ) performed on 05/13/2023 Abnormal stress test with small area of ischemia along with elevated TID of 1.34.  This can suggest multivessel CAD.  Normal LV size with LVEF of 50%.  At least intermediate risk study.  Consider coronary angiography as clinically indicated.   TRANSTHORACIC ECHOCARDIOGRAM performed on 05/13/2023 Normal left ventricular systolic function with an EF of 55% No LVH No regional wall motion abnormalities Right ventricular size and function normal Mild MR, trivial PR, trivial TR Normal gradients; no valvular stenosis  IMPRESSION AND PLAN: Keith Dorsey has been referred for pre-anesthesia review and clearance prior to him undergoing the planned anesthetic and procedural courses. Available labs, pertinent testing, and imaging results were personally reviewed by me in preparation for upcoming operative/procedural course. [facility] medical record has been updated following extensive record review and patient interview with PAT staff.   This patient has been appropriately cleared by cardiology with an overall intermediate risk of  patient experiencing significant perioperative cardiovascular complications. here at [facility]. Based on clinical review performed today (09/21/24), barring any significant acute changes in the patient's overall condition, it is anticipated that he will be able to proceed with the planned surgical intervention. Any acute changes in clinical condition may necessitate his procedure being postponed and/or cancelled. Patient will meet with anesthesia team (MD and/or CRNA) on the day of his procedure for preoperative evaluation/assessment. Questions regarding anesthetic course will be fielded at that time.   Pre-surgical instructions were reviewed with the patient during his PAT appointment, and questions were fielded to satisfaction by PAT clinical staff. He has been instructed on which medications that he will need to hold prior to surgery, as well as the ones that have been deemed safe/appropriate to take on the day of his procedure. As part of the general education provided by PAT, patient made aware both verbally and in writing, that he  would need to abstain from the use of any illegal substances during his perioperative course. He was advised that failure to follow the provided instructions could necessitate case cancellation or result in serious perioperative complications up to and including death. Patient encouraged to contact PAT and/or his surgeon's office to discuss any questions or concerns that may arise prior to surgery; verbalized understanding.   [name], MSN, APRN, FNP-C, CEN [facility]  Perioperative Services Nurse Practitioner Phone: [phone] Fax: [phone] 09/21/24 8:30 AM  NOTE: This note has been prepared using Dragon dictation software. Despite my best ability to proofread, there is always the potential that unintentional transcriptional errors may still occur from this process. "

## 2024-09-22 ENCOUNTER — Other Ambulatory Visit: Payer: Self-pay

## 2024-09-22 ENCOUNTER — Ambulatory Visit: Payer: Self-pay | Admitting: Urgent Care

## 2024-09-22 ENCOUNTER — Encounter: Admission: RE | Payer: Self-pay | Source: Ambulatory Visit

## 2024-09-22 ENCOUNTER — Encounter: Payer: Self-pay | Admitting: Neurosurgery

## 2024-09-22 ENCOUNTER — Ambulatory Visit

## 2024-09-22 ENCOUNTER — Observation Stay
Admission: RE | Admit: 2024-09-22 | Discharge: 2024-09-23 | Disposition: A | Payer: Self-pay | Source: Ambulatory Visit | Attending: Neurosurgery | Admitting: Neurosurgery

## 2024-09-22 DIAGNOSIS — Z79899 Other long term (current) drug therapy: Secondary | ICD-10-CM | POA: Insufficient documentation

## 2024-09-22 DIAGNOSIS — M5416 Radiculopathy, lumbar region: Secondary | ICD-10-CM | POA: Diagnosis not present

## 2024-09-22 DIAGNOSIS — Z01818 Encounter for other preprocedural examination: Secondary | ICD-10-CM

## 2024-09-22 DIAGNOSIS — I10 Essential (primary) hypertension: Secondary | ICD-10-CM | POA: Insufficient documentation

## 2024-09-22 DIAGNOSIS — Z7984 Long term (current) use of oral hypoglycemic drugs: Secondary | ICD-10-CM | POA: Insufficient documentation

## 2024-09-22 DIAGNOSIS — Z981 Arthrodesis status: Secondary | ICD-10-CM

## 2024-09-22 DIAGNOSIS — R7309 Other abnormal glucose: Secondary | ICD-10-CM

## 2024-09-22 DIAGNOSIS — M5417 Radiculopathy, lumbosacral region: Secondary | ICD-10-CM | POA: Diagnosis present

## 2024-09-22 DIAGNOSIS — M48062 Spinal stenosis, lumbar region with neurogenic claudication: Secondary | ICD-10-CM | POA: Insufficient documentation

## 2024-09-22 DIAGNOSIS — E119 Type 2 diabetes mellitus without complications: Secondary | ICD-10-CM | POA: Insufficient documentation

## 2024-09-22 DIAGNOSIS — I2510 Atherosclerotic heart disease of native coronary artery without angina pectoris: Secondary | ICD-10-CM | POA: Insufficient documentation

## 2024-09-22 DIAGNOSIS — M545 Low back pain, unspecified: Secondary | ICD-10-CM

## 2024-09-22 DIAGNOSIS — Z01812 Encounter for preprocedural laboratory examination: Secondary | ICD-10-CM

## 2024-09-22 DIAGNOSIS — M4316 Spondylolisthesis, lumbar region: Secondary | ICD-10-CM | POA: Diagnosis not present

## 2024-09-22 DIAGNOSIS — G8929 Other chronic pain: Secondary | ICD-10-CM | POA: Diagnosis present

## 2024-09-22 HISTORY — DX: Depression, unspecified: F32.A

## 2024-09-22 HISTORY — DX: Other spondylosis with radiculopathy, lumbar region: M47.26

## 2024-09-22 HISTORY — DX: Other specified health status: Z78.9

## 2024-09-22 HISTORY — DX: Unspecified right bundle-branch block: I45.10

## 2024-09-22 HISTORY — DX: Precordial pain: R07.2

## 2024-09-22 LAB — HEMOGLOBIN A1C
Hgb A1c MFr Bld: 7.3 % — ABNORMAL HIGH (ref 4.8–5.6)
Mean Plasma Glucose: 162.81 mg/dL

## 2024-09-22 LAB — GLUCOSE, CAPILLARY
Glucose-Capillary: 246 mg/dL — ABNORMAL HIGH (ref 70–99)
Glucose-Capillary: 294 mg/dL — ABNORMAL HIGH (ref 70–99)
Glucose-Capillary: 302 mg/dL — ABNORMAL HIGH (ref 70–99)
Glucose-Capillary: 309 mg/dL — ABNORMAL HIGH (ref 70–99)

## 2024-09-22 LAB — CREATININE, SERUM
Creatinine, Ser: 0.81 mg/dL (ref 0.61–1.24)
GFR, Estimated: >60 mL/min

## 2024-09-22 LAB — ABO/RH: ABO/RH(D): O POS

## 2024-09-22 MED ORDER — GLYCOPYRROLATE 0.2 MG/ML IJ SOLN
INTRAMUSCULAR | Status: DC | PRN
  Administered 2024-09-22: .2 mg via INTRAVENOUS

## 2024-09-22 MED ORDER — CHLORHEXIDINE GLUCONATE 0.12 % MT SOLN
15.0000 mL | Freq: Once | OROMUCOSAL | Status: AC
  Administered 2024-09-22: 15 mL via OROMUCOSAL

## 2024-09-22 MED ORDER — DIPHENHYDRAMINE HCL 50 MG/ML IJ SOLN
12.5000 mg | Freq: Once | INTRAMUSCULAR | Status: AC
  Administered 2024-09-22: 12.5 mg via INTRAVENOUS

## 2024-09-22 MED ORDER — ACETAMINOPHEN 10 MG/ML IV SOLN
INTRAVENOUS | Status: DC | PRN
  Administered 2024-09-22: 1000 mg via INTRAVENOUS

## 2024-09-22 MED ORDER — METHOCARBAMOL 1000 MG/10ML IJ SOLN: 500.0000 mg | Freq: Four times a day (QID) | INTRAMUSCULAR | Status: DC | PRN

## 2024-09-22 MED ORDER — FENTANYL CITRATE (PF) 100 MCG/2ML IJ SOLN
INTRAMUSCULAR | Status: AC
  Filled 2024-09-22: qty 2

## 2024-09-22 MED ORDER — REMIFENTANIL HCL 1 MG IV SOLR
INTRAVENOUS | Status: AC
  Filled 2024-09-22: qty 1000

## 2024-09-22 MED ORDER — VANCOMYCIN HCL IN DEXTROSE 1-5 GM/200ML-% IV SOLN
INTRAVENOUS | Status: AC
  Filled 2024-09-22: qty 200

## 2024-09-22 MED ORDER — DOCUSATE SODIUM 100 MG PO CAPS
100.0000 mg | ORAL_CAPSULE | Freq: Two times a day (BID) | ORAL | Status: DC
  Administered 2024-09-22 – 2024-09-23 (×2): 100 mg via ORAL
  Filled 2024-09-22 (×2): qty 1

## 2024-09-22 MED ORDER — HYDROMORPHONE HCL 1 MG/ML IJ SOLN: 0.5000 mg | INTRAMUSCULAR | Status: AC | PRN

## 2024-09-22 MED ORDER — PHENOL 1.4 % MT LIQD: 1.0000 | OROMUCOSAL | Status: DC | PRN

## 2024-09-22 MED ORDER — INSULIN ASPART 100 UNIT/ML IJ SOLN
0.0000 [IU] | Freq: Every day | INTRAMUSCULAR | Status: DC
  Administered 2024-09-22: 4 [IU] via SUBCUTANEOUS
  Filled 2024-09-22: qty 4

## 2024-09-22 MED ORDER — 0.9 % SODIUM CHLORIDE (POUR BTL) OPTIME
TOPICAL | Status: DC | PRN
  Administered 2024-09-22: 300 mL

## 2024-09-22 MED ORDER — PHENYLEPHRINE 80 MCG/ML (10ML) SYRINGE FOR IV PUSH (FOR BLOOD PRESSURE SUPPORT)
PREFILLED_SYRINGE | INTRAVENOUS | Status: DC | PRN
  Administered 2024-09-22: 160 ug via INTRAVENOUS

## 2024-09-22 MED ORDER — ISOSORBIDE MONONITRATE ER 30 MG PO TB24
60.0000 mg | ORAL_TABLET | ORAL | Status: DC
  Administered 2024-09-23: 60 mg via ORAL
  Filled 2024-09-22: qty 2

## 2024-09-22 MED ORDER — IRRISEPT - 450ML BOTTLE WITH 0.05% CHG IN STERILE WATER, USP 99.95% OPTIME
TOPICAL | Status: DC | PRN
  Administered 2024-09-22: 225 mL

## 2024-09-22 MED ORDER — PHENYLEPHRINE HCL-NACL 20-0.9 MG/250ML-% IV SOLN
INTRAVENOUS | Status: DC | PRN
  Administered 2024-09-22: 25 ug/min via INTRAVENOUS

## 2024-09-22 MED ORDER — DEXAMETHASONE SOD PHOSPHATE PF 10 MG/ML IJ SOLN
INTRAMUSCULAR | Status: DC | PRN
  Administered 2024-09-22: 10 mg via INTRAVENOUS

## 2024-09-22 MED ORDER — KETOROLAC TROMETHAMINE 15 MG/ML IJ SOLN
15.0000 mg | Freq: Four times a day (QID) | INTRAMUSCULAR | Status: AC
  Administered 2024-09-22 – 2024-09-23 (×3): 15 mg via INTRAVENOUS
  Filled 2024-09-22 (×3): qty 1

## 2024-09-22 MED ORDER — METFORMIN HCL ER 500 MG PO TB24
ORAL_TABLET | ORAL | Status: AC
  Filled 2024-09-22: qty 2

## 2024-09-22 MED ORDER — INSULIN ASPART 100 UNIT/ML IJ SOLN
5.0000 [IU] | Freq: Once | INTRAMUSCULAR | Status: AC
  Administered 2024-09-22: 5 [IU] via SUBCUTANEOUS

## 2024-09-22 MED ORDER — OXYCODONE HCL 5 MG PO TABS
10.0000 mg | ORAL_TABLET | ORAL | Status: DC | PRN
  Administered 2024-09-23: 10 mg via ORAL
  Filled 2024-09-22: qty 2

## 2024-09-22 MED ORDER — PROPOFOL 10 MG/ML IV BOLUS
INTRAVENOUS | Status: AC
  Filled 2024-09-22: qty 20

## 2024-09-22 MED ORDER — SODIUM CHLORIDE 0.9% FLUSH
3.0000 mL | Freq: Two times a day (BID) | INTRAVENOUS | Status: DC
  Administered 2024-09-22 – 2024-09-23 (×2): 3 mL via INTRAVENOUS

## 2024-09-22 MED ORDER — POLYETHYLENE GLYCOL 3350 17 G PO PACK: 17.0000 g | PACK | Freq: Every day | ORAL | Status: DC | PRN

## 2024-09-22 MED ORDER — EMPAGLIFLOZIN 10 MG PO TABS
10.0000 mg | ORAL_TABLET | Freq: Every day | ORAL | Status: DC
  Administered 2024-09-22 – 2024-09-23 (×2): 10 mg via ORAL
  Filled 2024-09-22 (×3): qty 1

## 2024-09-22 MED ORDER — DIPHENHYDRAMINE HCL 50 MG/ML IJ SOLN
INTRAMUSCULAR | Status: AC
  Filled 2024-09-22: qty 1

## 2024-09-22 MED ORDER — METFORMIN HCL ER 500 MG PO TB24
1000.0000 mg | ORAL_TABLET | Freq: Every day | ORAL | Status: DC
  Administered 2024-09-22: 1000 mg via ORAL

## 2024-09-22 MED ORDER — ONDANSETRON HCL 4 MG PO TABS: 4.0000 mg | ORAL_TABLET | Freq: Four times a day (QID) | ORAL | Status: DC | PRN

## 2024-09-22 MED ORDER — SODIUM CHLORIDE 0.9 % IV SOLN: INTRAVENOUS | Status: DC

## 2024-09-22 MED ORDER — PROPOFOL 10 MG/ML IV BOLUS
INTRAVENOUS | Status: DC | PRN
  Administered 2024-09-22: 150 ug/kg/min via INTRAVENOUS
  Administered 2024-09-22: 160 mg via INTRAVENOUS

## 2024-09-22 MED ORDER — PHENYLEPHRINE HCL-NACL 20-0.9 MG/250ML-% IV SOLN
INTRAVENOUS | Status: AC
  Filled 2024-09-22: qty 250

## 2024-09-22 MED ORDER — LISINOPRIL 20 MG PO TABS
20.0000 mg | ORAL_TABLET | ORAL | Status: DC
  Administered 2024-09-23: 20 mg via ORAL

## 2024-09-22 MED ORDER — EPHEDRINE SULFATE-NACL 50-0.9 MG/10ML-% IV SOSY
PREFILLED_SYRINGE | INTRAVENOUS | Status: DC | PRN
  Administered 2024-09-22: 5 mg via INTRAVENOUS
  Administered 2024-09-22: 10 mg via INTRAVENOUS
  Administered 2024-09-22: 5 mg via INTRAVENOUS

## 2024-09-22 MED ORDER — SODIUM CHLORIDE 0.9 % IV SOLN: 250.0000 mL | INTRAVENOUS | Status: DC

## 2024-09-22 MED ORDER — CHLORHEXIDINE GLUCONATE 4 % EX SOLN
1.0000 | CUTANEOUS | 1 refills | Status: AC
  Filled 2024-09-22: qty 236, 30d supply, fill #0

## 2024-09-22 MED ORDER — CEFAZOLIN SODIUM-DEXTROSE 2-4 GM/100ML-% IV SOLN
2.0000 g | Freq: Once | INTRAVENOUS | Status: AC
  Administered 2024-09-22: 2 g via INTRAVENOUS

## 2024-09-22 MED ORDER — HYDROMORPHONE HCL 1 MG/ML IJ SOLN
INTRAMUSCULAR | Status: AC
  Filled 2024-09-22: qty 1

## 2024-09-22 MED ORDER — METHOCARBAMOL 500 MG PO TABS
500.0000 mg | ORAL_TABLET | Freq: Four times a day (QID) | ORAL | Status: DC | PRN
  Administered 2024-09-22 – 2024-09-23 (×2): 500 mg via ORAL
  Filled 2024-09-22 (×2): qty 1

## 2024-09-22 MED ORDER — PROPOFOL 1000 MG/100ML IV EMUL
INTRAVENOUS | Status: AC
  Filled 2024-09-22: qty 100

## 2024-09-22 MED ORDER — BUPIVACAINE-EPINEPHRINE 0.5% -1:200000 IJ SOLN
INTRAMUSCULAR | Status: DC | PRN
  Administered 2024-09-22: 10 mL
  Administered 2024-09-22: 4 mL

## 2024-09-22 MED ORDER — ENOXAPARIN SODIUM 40 MG/0.4ML IJ SOSY
40.0000 mg | PREFILLED_SYRINGE | INTRAMUSCULAR | Status: DC
  Administered 2024-09-23: 40 mg via SUBCUTANEOUS
  Filled 2024-09-22: qty 0.4

## 2024-09-22 MED ORDER — GLIPIZIDE ER 10 MG PO TB24
10.0000 mg | ORAL_TABLET | Freq: Every day | ORAL | Status: DC
  Administered 2024-09-23: 10 mg via ORAL
  Filled 2024-09-22: qty 1

## 2024-09-22 MED ORDER — ACETAMINOPHEN 10 MG/ML IV SOLN
INTRAVENOUS | Status: AC
  Filled 2024-09-22: qty 100

## 2024-09-22 MED ORDER — SENNA 8.6 MG PO TABS
1.0000 | ORAL_TABLET | Freq: Two times a day (BID) | ORAL | Status: DC
  Administered 2024-09-22 – 2024-09-23 (×2): 8.6 mg via ORAL
  Filled 2024-09-22 (×2): qty 1

## 2024-09-22 MED ORDER — TRAZODONE HCL 50 MG PO TABS
50.0000 mg | ORAL_TABLET | Freq: Every day | ORAL | Status: DC
  Administered 2024-09-22: 50 mg via ORAL
  Filled 2024-09-22: qty 1

## 2024-09-22 MED ORDER — SORBITOL 70 % SOLN: 30.0000 mL | Freq: Every day | Status: DC | PRN

## 2024-09-22 MED ORDER — ACETAMINOPHEN 500 MG PO TABS
1000.0000 mg | ORAL_TABLET | Freq: Four times a day (QID) | ORAL | Status: DC
  Administered 2024-09-22 – 2024-09-23 (×2): 1000 mg via ORAL
  Filled 2024-09-22 (×3): qty 2

## 2024-09-22 MED ORDER — DEXMEDETOMIDINE HCL IN NACL 200 MCG/50ML IV SOLN
INTRAVENOUS | Status: DC | PRN
  Administered 2024-09-22: 20 ug via INTRAVENOUS

## 2024-09-22 MED ORDER — SUGAMMADEX SODIUM 200 MG/2ML IV SOLN
INTRAVENOUS | Status: DC | PRN
  Administered 2024-09-22 (×2): 200 mg via INTRAVENOUS

## 2024-09-22 MED ORDER — LIDOCAINE HCL (PF) 2 % IJ SOLN
INTRAMUSCULAR | Status: AC
  Filled 2024-09-22: qty 5

## 2024-09-22 MED ORDER — CHLORHEXIDINE GLUCONATE 0.12 % MT SOLN
OROMUCOSAL | Status: AC
  Filled 2024-09-22: qty 15

## 2024-09-22 MED ORDER — OXYCODONE HCL 5 MG PO TABS
5.0000 mg | ORAL_TABLET | ORAL | Status: DC | PRN
  Administered 2024-09-22 (×2): 5 mg via ORAL
  Filled 2024-09-22 (×2): qty 1

## 2024-09-22 MED ORDER — INSULIN ASPART 100 UNIT/ML IJ SOLN
0.0000 [IU] | Freq: Three times a day (TID) | INTRAMUSCULAR | Status: DC
  Administered 2024-09-22: 11 [IU] via SUBCUTANEOUS
  Administered 2024-09-23: 3 [IU] via SUBCUTANEOUS
  Filled 2024-09-22: qty 11
  Filled 2024-09-22: qty 3

## 2024-09-22 MED ORDER — ONDANSETRON HCL 4 MG/2ML IJ SOLN: 4.0000 mg | Freq: Four times a day (QID) | INTRAMUSCULAR | Status: DC | PRN

## 2024-09-22 MED ORDER — OXYCODONE HCL 5 MG PO TABS
ORAL_TABLET | ORAL | Status: AC
  Filled 2024-09-22: qty 1

## 2024-09-22 MED ORDER — MENTHOL 3 MG MT LOZG: 1.0000 | LOZENGE | OROMUCOSAL | Status: DC | PRN

## 2024-09-22 MED ORDER — OXYCODONE HCL 5 MG/5ML PO SOLN: 5.0000 mg | Freq: Once | ORAL | Status: AC | PRN

## 2024-09-22 MED ORDER — VANCOMYCIN HCL IN DEXTROSE 1-5 GM/200ML-% IV SOLN
1000.0000 mg | Freq: Once | INTRAVENOUS | Status: AC
  Administered 2024-09-22: 1000 mg via INTRAVENOUS

## 2024-09-22 MED ORDER — REMIFENTANIL HCL 1 MG IV SOLR
INTRAVENOUS | Status: DC | PRN
  Administered 2024-09-22: .1 ug/kg/min via INTRAVENOUS

## 2024-09-22 MED ORDER — ROCURONIUM BROMIDE 100 MG/10ML IV SOLN
INTRAVENOUS | Status: DC | PRN
  Administered 2024-09-22 (×2): 50 mg via INTRAVENOUS

## 2024-09-22 MED ORDER — MAGNESIUM CITRATE PO SOLN: 1.0000 | Freq: Once | ORAL | Status: DC | PRN

## 2024-09-22 MED ORDER — BUPIVACAINE-EPINEPHRINE (PF) 0.5% -1:200000 IJ SOLN
INTRAMUSCULAR | Status: AC
  Filled 2024-09-22: qty 10

## 2024-09-22 MED ORDER — ONDANSETRON HCL 4 MG/2ML IJ SOLN
INTRAMUSCULAR | Status: DC | PRN
  Administered 2024-09-22: 4 mg via INTRAVENOUS

## 2024-09-22 MED ORDER — SODIUM CHLORIDE 0.9% FLUSH: 3.0000 mL | INTRAVENOUS | Status: DC | PRN

## 2024-09-22 MED ORDER — MIDAZOLAM HCL 2 MG/2ML IJ SOLN
INTRAMUSCULAR | Status: AC
  Filled 2024-09-22: qty 2

## 2024-09-22 MED ORDER — CEFAZOLIN SODIUM-DEXTROSE 2-4 GM/100ML-% IV SOLN
INTRAVENOUS | Status: AC
  Filled 2024-09-22: qty 100

## 2024-09-22 MED ORDER — MIDAZOLAM HCL (PF) 2 MG/2ML IJ SOLN
INTRAMUSCULAR | Status: DC | PRN
  Administered 2024-09-22: 2 mg via INTRAVENOUS

## 2024-09-22 MED ORDER — SODIUM CHLORIDE 0.9 % IV SOLN: INTRAVENOUS | Status: DC | PRN

## 2024-09-22 MED ORDER — FENTANYL CITRATE (PF) 100 MCG/2ML IJ SOLN
INTRAMUSCULAR | Status: DC | PRN
  Administered 2024-09-22: 50 ug via INTRAVENOUS
  Administered 2024-09-22: 100 ug via INTRAVENOUS
  Administered 2024-09-22: 50 ug via INTRAVENOUS

## 2024-09-22 MED ORDER — LIDOCAINE HCL (CARDIAC) PF 100 MG/5ML IV SOSY
PREFILLED_SYRINGE | INTRAVENOUS | Status: DC | PRN
  Administered 2024-09-22: 60 mg via INTRAVENOUS

## 2024-09-22 MED ORDER — OXYCODONE HCL 5 MG PO TABS
5.0000 mg | ORAL_TABLET | Freq: Once | ORAL | Status: AC | PRN
  Administered 2024-09-22: 5 mg via ORAL

## 2024-09-22 MED ORDER — ROCURONIUM BROMIDE 10 MG/ML (PF) SYRINGE
PREFILLED_SYRINGE | INTRAVENOUS | Status: AC
  Filled 2024-09-22: qty 10

## 2024-09-22 MED ORDER — INSULIN ASPART 100 UNIT/ML IJ SOLN
INTRAMUSCULAR | Status: AC
  Filled 2024-09-22: qty 5

## 2024-09-22 MED ORDER — METOPROLOL TARTRATE 25 MG PO TABS
25.0000 mg | ORAL_TABLET | Freq: Two times a day (BID) | ORAL | Status: DC
  Administered 2024-09-22 – 2024-09-23 (×2): 25 mg via ORAL
  Filled 2024-09-22 (×2): qty 1

## 2024-09-22 MED ORDER — MUPIROCIN 2 % EX OINT
1.0000 | TOPICAL_OINTMENT | Freq: Two times a day (BID) | CUTANEOUS | 0 refills | Status: AC
  Filled 2024-09-22: qty 22, 30d supply, fill #0

## 2024-09-22 MED ORDER — SURGIFLO WITH THROMBIN (HEMOSTATIC MATRIX KIT) OPTIME
TOPICAL | Status: DC | PRN
  Administered 2024-09-22: 1 via TOPICAL

## 2024-09-22 MED ORDER — HYDROMORPHONE HCL 1 MG/ML IJ SOLN
0.2500 mg | INTRAMUSCULAR | Status: DC | PRN
  Administered 2024-09-22: 0.25 mg via INTRAVENOUS
  Administered 2024-09-22 (×3): 0.5 mg via INTRAVENOUS

## 2024-09-22 NOTE — Anesthesia Preprocedure Evaluation (Signed)
 "                                  Anesthesia Evaluation  Patient identified by MRN, date of birth, ID band Patient awake    Reviewed: Allergy & Precautions, NPO status , Patient's Chart, lab work & pertinent test results  History of Anesthesia Complications Negative for: history of anesthetic complications  Airway Mallampati: III  TM Distance: >3 FB Neck ROM: full    Dental no notable dental hx.    Pulmonary neg pulmonary ROS   Pulmonary exam normal        Cardiovascular hypertension, + CAD  Normal cardiovascular exam+ dysrhythmias      Neuro/Psych  PSYCHIATRIC DISORDERS Anxiety Depression     Neuromuscular disease    GI/Hepatic negative GI ROS, Neg liver ROS,,,  Endo/Other  diabetes, Type 2    Renal/GU negative Renal ROS  negative genitourinary   Musculoskeletal   Abdominal   Peds  Hematology negative hematology ROS (+)   Anesthesia Other Findings Past Medical History: No date: Anxiety No date: CAD (coronary artery disease) 2001: Colon cancer (HCC)     Comment:  a.) s/p partial colectomy No date: DDD (degenerative disc disease), lumbar No date: Depression No date: DM (diabetes mellitus), type 2 (HCC) No date: ED (erectile dysfunction) No date: Hx of [place] spotted fever No date: Hyperlipidemia No date: Hypertension No date: Incomplete RBBB No date: Insomnia     Comment:  a.) takes trazodone  PRN No date: Osteoarthritis of spine with radiculopathy, lumbar region No date: Precordial pain No date: Spondylolisthesis at L3-L4 level No date: Statin intolerance  Past Surgical History: 2001: COLON SURGERY     Comment:  due to colon cancer s/p resection 08/27/2023: CORONARY PRESSURE/FFR STUDY; N/A     Comment:  Procedure: CORONARY PRESSURE/FFR STUDY;  Surgeon:               Ammon Blunt, MD;  Location: [facility] INVASIVE CV               LAB;  Service: Cardiovascular;  Laterality: N/A; 08/27/2023: CORONARY STENT INTERVENTION;  N/A     Comment:  Procedure: CORONARY STENT INTERVENTION;  Surgeon:               Ammon Blunt, MD;  Location: [facility] INVASIVE CV               LAB;  Service: Cardiovascular;  Laterality: N/A; 05/21/2023: LEFT HEART CATH AND CORONARY ANGIOGRAPHY; Left     Comment:  Procedure: LEFT HEART CATH AND CORONARY ANGIOGRAPHY;                Surgeon: Florencio Cara BIRCH, MD;  Location: [facility] INVASIVE              CV LAB;  Service: Cardiovascular;  Laterality: Left;     Reproductive/Obstetrics negative OB ROS                              Anesthesia Physical Anesthesia Plan  ASA: 3  Anesthesia Plan: General ETT   Post-op Pain Management: Toradol  IV (intra-op)*, Ofirmev  IV (intra-op)* and Regional block*   Induction:   PONV Risk Score and Plan: 2 and Ondansetron , Dexamethasone , Midazolam  and Treatment may vary due to age or medical condition  Airway Management Planned:   Additional Equipment:   Intra-op Plan:   Post-operative  Plan:   Informed Consent: I have reviewed the patients History and Physical, chart, labs and discussed the procedure including the risks, benefits and alternatives for the proposed anesthesia with the patient or authorized representative who has indicated his/her understanding and acceptance.     Dental Advisory Given  Plan Discussed with: Anesthesiologist, CRNA and Surgeon  Anesthesia Plan Comments:         Anesthesia Quick Evaluation  "

## 2024-09-22 NOTE — Discharge Instructions (Signed)
 Your surgeon has performed an operation on your lumbar spine (low back) to relieve pressure on one or more nerves. Many times, patients feel better immediately after surgery and can "overdo it." Even if you feel well, it is important that you follow these activity guidelines. If you do not let your back heal properly from the surgery, you can increase the chance of hardware complications and/or return of your symptoms. The following are instructions to help in your recovery once you have been discharged from the hospital.  Do not use NSAIDs for 3 months after surgery.  *Regarding compression stockings-  Please wear day and night until you are walking a couple hundred feet three times a day.   Activity    No bending, lifting, or twisting ("BLT"). Avoid lifting objects heavier than 10 pounds (gallon milk jug).  Where possible, avoid household activities that involve lifting, bending, pushing, or pulling such as laundry, vacuuming, grocery shopping, and childcare. Try to arrange for help from friends and family for these activities while your back heals.  Increase physical activity slowly as tolerated.  Taking short walks is encouraged, but avoid strenuous exercise. Do not jog, run, bicycle, lift weights, or participate in any other exercises unless specifically allowed by your doctor. Avoid prolonged sitting, including car rides.  Talk to your doctor before resuming sexual activity.  You should not drive until cleared by your doctor.  Until released by your doctor, you should not return to work or school.  You should rest at home and let your body heal.   You may shower three days after your surgery.  After showering, lightly dab your incision dry. Do not take a tub bath or go swimming for 3 weeks, or until approved by your doctor at your follow-up appointment.  If you smoke, we strongly recommend that you quit.  Smoking has been proven to interfere with normal healing in your back and will  dramatically reduce the success rate of your surgery. Please contact [facility] (800-QUIT-NOW) and use the resources at www.[facility].com for assistance in stopping smoking.  Surgical Incision   If you have a dressing on your incision, you may remove it three days after your surgery. Keep your incision area clean and dry.  Your incision was closed with Dermabond glue. The glue should begin to peel away within about a week.  Diet            You may return to your usual diet. Be sure to stay hydrated.  When to Contact Us   Although your surgery and recovery will likely be uneventful, you may have some residual numbness, aches, and pains in your back and/or legs. This is normal and should improve in the next few weeks.  However, should you experience any of the following, contact us  immediately: New numbness or weakness Pain that is progressively getting worse, and is not relieved by your pain medications or rest Bleeding, redness, swelling, pain, or drainage from surgical incision Chills or flu-like symptoms Fever greater than 101.0 F (38.3 C) Problems with bowel or bladder functions Difficulty breathing or shortness of breath Warmth, tenderness, or swelling in your calf  Contact Information How to contact us :  If you have any questions/concerns before or after surgery, you can reach us  at [phone], or you can send a mychart message. We can be reached by phone or mychart 8am-4pm, Monday-Friday.  *Please note: Calls after 4pm are forwarded to a third party answering service. Mychart messages are not routinely monitored during evenings,  weekends, and holidays. Please call our office to contact the answering service for urgent concerns during non-business hours.

## 2024-09-22 NOTE — Anesthesia Procedure Notes (Signed)
 Procedure Name: Intubation Date/Time: 09/22/2024 10:36 AM  Performed by: Jaylene Nest, CRNAPre-anesthesia Checklist: Patient identified, Patient being monitored, Timeout performed, Emergency Drugs available and Suction available Patient Re-evaluated:Patient Re-evaluated prior to induction Oxygen Delivery Method: Circle system utilized Preoxygenation: Pre-oxygenation with 100% oxygen Induction Type: IV induction Ventilation: Mask ventilation without difficulty Laryngoscope Size: McGrath and 4 Grade View: Grade I Tube type: Oral Tube size: 7.5 mm Number of attempts: 1 Airway Equipment and Method: Stylet and Video-laryngoscopy Placement Confirmation: ETT inserted through vocal cords under direct vision, positive ETCO2 and breath sounds checked- equal and bilateral Secured at: 22 cm Tube secured with: Tape Dental Injury: Teeth and Oropharynx as per pre-operative assessment
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
## 2024-09-23 ENCOUNTER — Encounter: Payer: Self-pay | Admitting: Neurosurgery

## 2024-09-23 ENCOUNTER — Other Ambulatory Visit: Payer: Self-pay

## 2024-09-23 LAB — GLUCOSE, CAPILLARY: Glucose-Capillary: 194 mg/dL — ABNORMAL HIGH (ref 70–99)

## 2024-09-23 MED ORDER — OXYCODONE HCL 5 MG PO TABS
5.0000 mg | ORAL_TABLET | ORAL | 0 refills | Status: DC | PRN
  Filled 2024-09-23: qty 42, 7d supply, fill #0

## 2024-09-23 MED ORDER — LISINOPRIL 20 MG PO TABS
ORAL_TABLET | ORAL | Status: AC
  Filled 2024-09-23: qty 1

## 2024-09-23 MED ORDER — METHOCARBAMOL 500 MG PO TABS
500.0000 mg | ORAL_TABLET | Freq: Four times a day (QID) | ORAL | 0 refills | Status: AC | PRN
  Filled 2024-09-23: qty 120, 30d supply, fill #0

## 2024-09-23 MED ORDER — POLYETHYLENE GLYCOL 3350 17 GM/SCOOP PO POWD
17.0000 g | Freq: Every day | ORAL | 0 refills | Status: AC | PRN
  Filled 2024-09-23: qty 238, 14d supply, fill #0

## 2024-09-23 MED ORDER — SENNA 8.6 MG PO TABS
1.0000 | ORAL_TABLET | Freq: Two times a day (BID) | ORAL | 0 refills | Status: AC | PRN
  Filled 2024-09-23: qty 30, 15d supply, fill #0

## 2024-09-23 NOTE — Discharge Summary (Signed)
 Discharge Summary  Patient ID: Keith Dorsey MRN: [id number] DOB/AGE: October 21, 1965 59 y.o.  Admit date: 09/22/2024 Discharge date: 09/23/2024  Admission Diagnoses: Lumbar Spondylolisthesis, L3/4 Severe Radiculopathy  Discharge Diagnoses:  Principal Problem:   S/P lumbar fusion Active Problems:   Chronic right-sided low back pain   Spondylolisthesis at L3-L4 level   Lumbosacral radiculopathy at L3   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-sided sciatica   Discharged Condition: good  Hospital Course:  Keith Dorsey is a 59 y.o presenting with L3-4 spondylolisthesis and radiculopathy status post L3-5 XLIF and PSF.  His intraoperative course was uncomplicated.  He was admitted for pain control and therapy evaluation.  He was seen and evaluated by therapy and deemed appropriate for discharge home on postop day 1 without further needs.  His pain was well-controlled on oral medications.  He was discharged with medications for pain, muscle laxer, and stool softeners.  Consults: None  Significant Diagnostic Studies: see results review  Treatments: surgery: as above. Please see separately dictated operative report for further details   Discharge Exam: Blood pressure (!) 156/93, pulse 65, temperature (!) 97.1 F (36.2 C), temperature source Temporal, resp. rate 17, height 5' 8 (1.727 m), weight 82.1 kg, SpO2 95%. AA Ox3 CNI    strength:   Side Iliopsoas Quads Hamstring PF DF EHL  R 5 5 5 5 5 5   L 5 5 5 5 5 5    Incisions: c/d/I with dressings in place  Disposition: Discharge disposition: 01-Home or Self Care       Discharge Instructions     Incentive spirometry RT   Complete by: As directed    Remove dressing in 48 hours   Complete by: As directed       Allergies as of 09/23/2024       Reactions   Acacia Other (See Comments)   Diphenhydramine -acetaminophen  Other (See Comments)   Other Itching   Patient states he is allergic to all pain pills. Makes him itch   Patient states he is allergic to all pain pills. Makes him itch         Medication List     TAKE these medications    aspirin  EC 81 MG tablet Take 81 mg by mouth daily. Swallow whole.   chlorhexidine  4 % external liquid Commonly known as: HIBICLENS  Apply 15 mLs (1 Application total) topically as directed for 30 doses. Use as directed daily for 5 days every other week for 6 weeks.   empagliflozin  10 MG Tabs tablet Commonly known as: JARDIANCE  Take 10 mg by mouth daily.   glipiZIDE  10 MG 24 hr tablet Commonly known as: GLUCOTROL  XL Take 10 mg by mouth daily with breakfast.   isosorbide  mononitrate 60 MG 24 hr tablet Commonly known as: IMDUR  Take 60 mg by mouth every morning.   lisinopril  20 MG tablet Commonly known as: ZESTRIL  Take 20 mg by mouth every morning.   metFORMIN  500 MG 24 hr tablet Commonly known as: GLUCOPHAGE -XR Take 1,000 mg by mouth daily with supper.   methocarbamol  500 MG tablet Commonly known as: ROBAXIN  Take 1 tablet (500 mg total) by mouth every 6 (six) hours as needed for muscle spasms.   metoprolol  tartrate 25 MG tablet Commonly known as: LOPRESSOR  Take 25 mg by mouth 2 (two) times daily.   mupirocin  ointment 2 % Commonly known as: BACTROBAN  Place 1 Application into the nose 2 (two) times daily for 60 doses. Use as directed 2 times daily for 5 days every  other week for 6 weeks.   oxyCODONE  5 MG immediate release tablet Commonly known as: Oxy IR/ROXICODONE  Take 1-2 tablets (5-10 mg total) by mouth every 4 (four) hours as needed for moderate pain (pain score 4-6) or severe pain (pain score 7-10).   polyethylene glycol 17 g packet Commonly known as: MIRALAX  / GLYCOLAX  Take 17 g by mouth daily as needed for moderate constipation.   senna 8.6 MG Tabs tablet Commonly known as: SENOKOT Take 1 tablet (8.6 mg total) by mouth 2 (two) times daily as needed for mild constipation.   traZODone  50 MG tablet Commonly known as: DESYREL  Take 50 mg by  mouth at bedtime.               Durable Medical Equipment  (From admission, onward)           Start     Ordered   09/23/24 1018  For home use only DME Bedside commode  Once       Question:  Patient needs a bedside commode to treat with the following condition  Answer:  S/P lumbar fusion   09/23/24 1017   09/23/24 1018  For home use only DME Walker rolling  Once       Question Answer Comment  Walker: With 5 Inch Wheels   Patient needs a walker to treat with the following condition S/P lumbar fusion      09/23/24 1017             Signed: Edsel Jama Goods 09/23/2024, 10:38 AM

## 2024-09-23 NOTE — Plan of Care (Signed)
  Problem: Pain Management: Goal: Pain level will decrease Outcome: Progressing   Problem: Bladder/Genitourinary: Goal: Urinary functional status for postoperative course will improve Outcome: Progressing

## 2024-09-23 NOTE — Progress Notes (Signed)
" ° °  Neurosurgery Progress Note  History: Keith Dorsey is s/p L3-5 XLIF and PSF  POD1: Pt doing well today. Expressing some back and right groin pain but feels it is manageable   Physical Exam: Vitals:   09/22/24 2321 09/23/24 0447  BP: (!) 145/75 (!) 148/80  Pulse: 78 60  Resp: 18 18  Temp: (!) 97.3 F (36.3 C) (!) 97.1 F (36.2 C)  SpO2: 93% 94%    AA Ox3 CNI   strength:   Side Iliopsoas Quads Hamstring PF DF EHL  R 5 5 5 5 5 5   L 5 5 5 5 5 5    Incisions: c/d/I with dressings in place  Data:  Other tests/results:  Lab Results  Component Value Date   HGBA1C 7.3 (H) 09/22/2024    Assessment/Plan:  Keith Dorsey is a 59 y.o presenting with lumbar spondylolisthesis s/p L3-4 XLIF and PSF.   - mobilize - pain control - DVT prophylaxis - PTOT - dispo planning underway  Edsel Goods PA-C Department of Neurosurgery    "

## 2024-09-23 NOTE — TOC Initial Note (Signed)
 Transition of Care [facility]) - Initial/Assessment Note    Patient Details  Name: Keith Dorsey MRN: [id number] Date of Birth: 1965-12-09  Transition of [facility]) CM/SW Contact:    Fancy Dunkley L Shervin Cypert, LCSW Phone Number: 09/23/2024, 10:37 AM  Clinical Narrative:                  CSW spoke with patient. Recommendations for DME discussed. Patient declined the [facility] but was agreeable to the RW.   RW ordered through [employer]. DME will be processed if patient is eligible.        Patient Goals and CMS Choice            Expected Discharge Plan and Services         Expected Discharge Date: 09/23/24                                    Prior Living Arrangements/Services                       Activities of Daily Living   ADL Screening (condition at time of admission) Independently performs ADLs?: Yes (appropriate for developmental age) Is the patient deaf or have difficulty hearing?: No Does the patient have difficulty seeing, even when wearing glasses/contacts?: No Does the patient have difficulty concentrating, remembering, or making decisions?: No  Permission Sought/Granted                  Emotional Assessment              Admission diagnosis:  Chronic right-sided low back pain, unspecified whether sciatica present [M54.50, G89.29] Spondylolisthesis at L3-L4 level [M43.16] Lumbosacral radiculopathy at L3 [M54.17] 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-sided sciatica [G89.29, M54.41] S/P lumbar fusion [Z98.1] Patient Active Problem List   Diagnosis Date Noted   S/P lumbar fusion 09/22/2024   Chronic right-sided low back pain 07/22/2024   Spondylolisthesis at L3-L4 level 07/22/2024   Lumbosacral radiculopathy at L3 07/22/2024   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-sided sciatica 07/22/2024   Atherosclerosis of native coronary artery of native heart 06/12/2023   Vasculogenic erectile dysfunction 06/07/2022   Disc degeneration, lumbar  10/01/2019   Benign essential HTN 03/19/2017   Hyperlipidemia, mixed 03/19/2017   Moderate episode of recurrent major depressive disorder (HCC) 07/06/2015   Personal history of colon cancer 07/06/2015   Primary insomnia 07/06/2015   Diabetes mellitus type 2, uncomplicated (HCC) 03/21/2015   PCP:  Delfina Pao, MD Pharmacy:   [facility] - [place], [place] - [address] [place] [address] Phone: [phone] Fax: [phone]  [facility] [address] [place] [address] Phone: [phone] Fax: [phone]     Social Drivers of Health (SDOH) Social History: SDOH Screenings   Food Insecurity: No Food Insecurity (09/22/2024)  Housing: Low Risk (09/22/2024)  Transportation Needs: No Transportation Needs (09/22/2024)  Utilities: Not At Risk (09/22/2024)  Financial Resource Strain: Low Risk  (08/03/2024)   Received from [facility]  Tobacco Use: Low Risk (09/22/2024)   SDOH Interventions:     Readmission Risk Interventions     No data to display

## 2024-09-23 NOTE — Progress Notes (Signed)
 The beneficiary has a mobility limitation that significantly impairs his/her ability to participate in one or more mobility-related activities of daily living (MRADL) in the home. The patient is able to safely use the walker. The functional mobility deficit can be sufficiently resolved by use of walker.

## 2024-09-23 NOTE — Anesthesia Postprocedure Evaluation (Signed)
"   Anesthesia Post Note  Patient: Jashan E Kimmons  Procedure(s) Performed: L3-4 Lateral interbody Fusion(Right), L3-4 Posterior Spional Fusion CT Navigation  Patient location during evaluation: PACU Anesthesia Type: General Level of consciousness: awake and alert Pain management: pain level controlled Vital Signs Assessment: post-procedure vital signs reviewed and stable Respiratory status: spontaneous breathing, nonlabored ventilation, respiratory function stable and patient connected to nasal cannula oxygen Cardiovascular status: blood pressure returned to baseline and stable Postop Assessment: no apparent nausea or vomiting Anesthetic complications: no   No notable events documented.   Last Vitals:  Vitals:   09/23/24 0447 09/23/24 0745  BP: (!) 148/80 (!) 156/93  Pulse: 60 65  Resp: 18 17  Temp: (!) 36.2 C   SpO2: 94% 95%    Last Pain:  Vitals:   09/23/24 0745  TempSrc:   PainSc: 5                  Lendia LITTIE Mae      "

## 2024-09-23 NOTE — Progress Notes (Signed)
 Pts BP 118/79, pt asymptomatic, Keith Dorsey notified. Per Keith pt ok to discharge.

## 2024-09-23 NOTE — Progress Notes (Signed)
 DISCHARGE NOTE:   Pt dc with IV removed and dc instructions given. Pt has both TED hose on and in place. Pt received medications delivered to hospital room. Pt also received RW delivered to hospital room. Pt voices no questions or concerns at this time. Pt wheeled down to medial mall entrance by staff. Pt's significant other provided transportation.

## 2024-09-23 NOTE — Plan of Care (Signed)
  Problem: Clinical Measurements: Goal: Postoperative complications will be avoided or minimized Outcome: Progressing   

## 2024-09-23 NOTE — Evaluation (Signed)
 Occupational Therapy Evaluation Patient Details Name: Keith Dorsey MRN: [id number] DOB: Jul 15, 1966 Today's Date: 09/23/2024   History of Present Illness   Pt is a 59 y/o M admitted on 09/22/24 for scheduled L3-4 lateral interbody fusion (right), L3-4 posterior spinal fusion. PMH: colon CA, DM, HTN     Clinical Impressions Pt was seen for OT evaluation this date. Prior to hospital admission, pt needed assistance with LB dressing but Mod I/I in ADLs. Pt lives with spouse. Pt currently requires Mod a for LB dressing. Good static standing balance no AD use for standing balance. Pt edu on LB dressing strategies, toileting, safe use of DME, and precautions for back. Pt limited by pain for further functional mobility. Pt does not needed OT follow up and will sign off.        If plan is discharge home, recommend the following:   A little help with walking and/or transfers;A lot of help with bathing/dressing/bathroom;Help with stairs or ramp for entrance;Assist for transportation;Assistance with cooking/housework     Functional Status Assessment   Patient has had a recent decline in their functional status and demonstrates the ability to make significant improvements in function in a reasonable and predictable amount of time.     Equipment Recommendations   None recommended by OT     Recommendations for Other Services         Precautions/Restrictions   Precautions Precautions: Fall;Back Precaution/Restrictions Comments: no brace Restrictions Weight Bearing Restrictions Per Provider Order: No     Mobility Bed Mobility               General bed mobility comments: Not tested    Transfers Overall transfer level: Needs assistance Equipment used: None Transfers: Sit to/from Stand Sit to Stand: Supervision                  Balance Overall balance assessment: Needs assistance Sitting-balance support: Feet supported Sitting balance-Leahy Scale: Good      Standing balance support: No upper extremity supported Standing balance-Leahy Scale: Good                             ADL either performed or assessed with clinical judgement   ADL Overall ADL's : Needs assistance/impaired                                       General ADL Comments: Pt Mod a for LB dressing. Pt edu on LB dressing strategies, toileting, and precautions.     Vision         Perception         Praxis         Pertinent Vitals/Pain Pain Assessment Pain Assessment: 0-10 Pain Score: 5  Pain Location: back Pain Descriptors / Indicators: Aching, Discomfort, Sore Pain Intervention(s): Monitored during session, Patient requesting pain meds-RN notified     Extremity/Trunk Assessment Upper Extremity Assessment Upper Extremity Assessment: Overall WFL for tasks assessed   Lower Extremity Assessment Lower Extremity Assessment: Generalized weakness   Cervical / Trunk Assessment Cervical / Trunk Assessment: Back Surgery   Communication Communication Communication: No apparent difficulties   Cognition Arousal: Alert Behavior During Therapy: WFL for tasks assessed/performed Cognition: No apparent impairments  Following commands: Intact       Cueing  General Comments   Cueing Techniques: Verbal cues;Tactile cues  PT educated pt on back precautions, answered pt's questions to the best of PT's ability.   Exercises     Shoulder Instructions      Home Living Family/patient expects to be discharged to:: Private residence Living Arrangements: Spouse/significant other Available Help at Discharge: Family;Available 24 hours/day Type of Home: House Home Access: Level entry     Home Layout: Two level;Able to live on main level with bedroom/bathroom     Bathroom Shower/Tub: Producer, Television/film/video: Standard     Home Equipment: Educational Psychologist (4 wheels)   Additional  Comments: stool to get into bed 2/2 elevated bed height      Prior Functioning/Environment Prior Level of Function : Independent/Modified Independent             Mobility Comments: ambulatory without AD, works in holiday representative (owns the company), denies falls in the past 6 months ADLs Comments: independent    OT Problem List: Decreased strength;Decreased range of motion;Decreased activity tolerance;Pain   OT Treatment/Interventions:        OT Goals(Current goals can be found in the care plan section)   Acute Rehab OT Goals Patient Stated Goal: go home OT Goal Formulation: With patient/family Time For Goal Achievement: 10/07/24 Potential to Achieve Goals: Good   OT Frequency:       Co-evaluation              AM-PAC OT 6 Clicks Daily Activity     Outcome Measure Help from another person eating meals?: None Help from another person taking care of personal grooming?: A Little Help from another person toileting, which includes using toliet, bedpan, or urinal?: A Little Help from another person bathing (including washing, rinsing, drying)?: A Lot Help from another person to put on and taking off regular upper body clothing?: A Little Help from another person to put on and taking off regular lower body clothing?: A Lot 6 Click Score: 17   End of Session    Activity Tolerance: Patient limited by pain Patient left: in chair;with call bell/phone within reach;with family/visitor present  OT Visit Diagnosis: Unsteadiness on feet (R26.81);Other abnormalities of gait and mobility (R26.89);Muscle weakness (generalized) (M62.81)                Time: 8971-8963 OT Time Calculation (min): 8 min Charges:  OT General Charges $OT Visit: 1 Visit OT Evaluation $OT Eval Low Complexity: 1 Low  Keith Dorsey  Keith Dorsey 09/23/2024, 11:29 AM

## 2024-09-23 NOTE — Evaluation (Signed)
 Physical Therapy Evaluation Patient Details Name: Keith Dorsey MRN: [id number] DOB: [birth year] Today's Date: 09/23/2024  History of Present Illness  Pt is a 59 y/o M admitted on 09/22/24 for scheduled L3-4 lateral interbody fusion (right), L3-4 posterior spinal fusion. PMH: colon CA, DM, HTN  Clinical Impression  Pt seen for PT evaluation with pt agreeable to tx, significant other (Buffy) present for session. Pt reports prior to admission he was independent without AD, working, driving. On this date, PT educates pt on back precautions, log rolling. Pt demonstrates BLE weakness requiring RW for safety & balance with mobility. PT educates pt on proper use of AD, pt requires CGA for gait. Recommend ongoing PT services to progress gait with LRAD, balance, strengthening.        If plan is discharge home, recommend the following: Assistance with cooking/housework;A little help with bathing/dressing/bathroom;Assist for transportation;Help with stairs or ramp for entrance;A little help with walking and/or transfers   Can travel by private vehicle        Equipment Recommendations Rolling walker (2 wheels);BSC/3in1  Recommendations for Other Services       Functional Status Assessment Patient has had a recent decline in their functional status and demonstrates the ability to make significant improvements in function in a reasonable and predictable amount of time.     Precautions / Restrictions Precautions Precautions: Fall;Back Precaution/Restrictions Comments: no brace Restrictions Weight Bearing Restrictions Per Provider Order: No      Mobility  Bed Mobility Overal bed mobility: Needs Assistance Bed Mobility: Sidelying to Sit, Rolling, Sit to Sidelying Rolling: Supervision Sidelying to sit: Supervision     Sit to sidelying: Supervision General bed mobility comments: education re: log rolling with pt return demonstrating x 2    Transfers Overall transfer level: Needs  assistance Equipment used: None, Rolling walker (2 wheels) Transfers: Sit to/from Stand Sit to Stand: Supervision, Contact guard assist           General transfer comment: sit>Stand without AD with pt pushing on BLE knees with BUE, cuing re: hand placement when transferring sit>Stand with RW    Ambulation/Gait Ambulation/Gait assistance: Contact guard assist Gait Distance (Feet): 150 Feet Assistive device: Rolling walker (2 wheels) Gait Pattern/deviations: Decreased step length - right, Decreased step length - left, Decreased stride length Gait velocity: decreased     General Gait Details: education to push RW vs pick it up  Stairs            Wheelchair Mobility     Tilt Bed    Modified Rankin (Stroke Patients Only)       Balance Overall balance assessment: Needs assistance Sitting-balance support: Feet supported Sitting balance-Leahy Scale: Good     Standing balance support: During functional activity, Bilateral upper extremity supported, Reliant on assistive device for balance Standing balance-Leahy Scale: Fair                               Pertinent Vitals/Pain Pain Assessment Pain Assessment: 0-10 Pain Score: 6  Pain Location: back Pain Descriptors / Indicators: Discomfort Pain Intervention(s): Monitored during session    Home Living Family/patient expects to be discharged to:: Private residence Living Arrangements: Spouse/significant other Available Help at Discharge: Family;Available 24 hours/day Type of Home: House Home Access: Level entry       Home Layout: Two level;Able to live on main level with bedroom/bathroom Home Equipment: Shower seat;Rollator (4 wheels) Additional Comments: stool to get into bed  2/2 elevated bed height    Prior Function Prior Level of Function : Independent/Modified Independent             Mobility Comments: ambulatory without AD, works in holiday representative (owns the company), 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 ADLs Comments: independent     Extremity/Trunk Assessment   Upper Extremity Assessment Upper Extremity Assessment: Overall WFL for tasks assessed    Lower Extremity Assessment Lower Extremity Assessment: Generalized weakness    Cervical / Trunk Assessment Cervical / Trunk Assessment: Back Surgery  Communication   Communication Communication: No apparent difficulties    Cognition Arousal: Alert Behavior During Therapy: WFL for tasks assessed/performed   PT - Cognitive impairments: Awareness, Safety/Judgement, Problem solving                       PT - Cognition Comments: Pt ambulating into bathroom but reporting he does not need to go, increased processing times at times throughout session. Following commands: Impaired Following commands impaired: Follows multi-step commands with increased time     Cueing Cueing Techniques: Verbal cues     General Comments General comments (skin integrity, edema, etc.): PT educated pt on back precautions, answered pt's questions to the best of PT's ability.    Exercises     Assessment/Plan    PT Assessment Patient needs continued PT services  PT Problem List Decreased strength;Decreased balance;Decreased mobility;Decreased activity tolerance;Decreased knowledge of precautions;Decreased safety awareness;Decreased knowledge of use of DME;Pain;Decreased range of motion;Decreased cognition       PT Treatment Interventions DME instruction;Therapeutic exercise;Gait training;Balance training;Stair training;Neuromuscular re-education;Functional mobility training;Therapeutic activities;Patient/family education    PT Goals (Current goals can be found in the Care Plan section)  Acute Rehab PT Goals Patient Stated Goal: go home PT Goal Formulation: With patient/family Time For Goal Achievement: 10/07/24 Potential to Achieve Goals: Good    Frequency 7X/week     Co-evaluation               AM-PAC PT 6  Clicks Mobility  Outcome Measure Help needed turning from your back to your side while in a flat bed without using bedrails?: None Help needed moving from lying on your back to sitting on the side of a flat bed without using bedrails?: None Help needed moving to and from a bed to a chair (including a wheelchair)?: A Little Help needed standing up from a chair using your arms (e.g., wheelchair or bedside chair)?: A Little Help needed to walk in hospital room?: A Little Help needed climbing 3-5 steps with a railing? : A Little 6 Click Score: 20    End of Session   Activity Tolerance: Patient tolerated treatment well Patient left: in chair;with call bell/phone within reach Nurse Communication: Mobility status PT Visit Diagnosis: Pain;Muscle weakness (generalized) (M62.81);Other abnormalities of gait and mobility (R26.89);Unsteadiness on feet (R26.81);Difficulty in walking, not elsewhere classified (R26.2) Pain - part of body:  (low back)    Time: 9063-9040 PT Time Calculation (min) (ACUTE ONLY): 23 min   Charges:   PT Evaluation $PT Eval Low Complexity: 1 Low PT Treatments $Therapeutic Activity: 8-22 mins PT General Charges $$ ACUTE PT VISIT: 1 Visit         Richerd Pinal, PT, DPT 09/23/24, 10:20 AM   Richerd CHRISTELLA Pinal 09/23/2024, 10:19 AM

## 2024-09-24 ENCOUNTER — Telehealth: Payer: Self-pay

## 2024-09-24 NOTE — Telephone Encounter (Signed)
 DOS: 09/22/2024 L3-4 Lateral interbody Fusion(Right), L3-4 Posterior Spional Fusion with Dr. Claudene  Attempted to return patient after hours call regarding medications. Patient wanted to know if he could take tylenol  with Robaxin  and Oxycodone . Left voicemail informing patient that he can take tylenol  1000mg  every 6-8H with robaxin  and oxycodone .   Additionally patient had complained of constipation. Informed patient that if he is having difficulty with constipation he can try magnesium  citrate over the counter.   Additionally sending this information in a mychart message. Informed if he has any additional questions to give us  a call.   Media Information   Document Information  Patient Summary: AMB Patient Logs/Info  AFTER HOURS NEUROSURGERY CALL  09/24/2024 16:42  Attached To:  Marzetta FORBES Goodnight Ron  Source Information  Default, Provider, MD

## 2024-09-25 ENCOUNTER — Telehealth: Payer: Self-pay | Admitting: Neurosurgery

## 2024-09-25 DIAGNOSIS — M4316 Spondylolisthesis, lumbar region: Secondary | ICD-10-CM

## 2024-09-25 DIAGNOSIS — Z981 Arthrodesis status: Secondary | ICD-10-CM

## 2024-09-25 MED ORDER — OXYCODONE HCL 5 MG PO TABS: 5.0000 mg | ORAL_TABLET | ORAL | 0 refills | Status: AC | PRN

## 2024-09-25 NOTE — Telephone Encounter (Signed)
 Patient notified and voiced understanding. He is tolerating the medication well.

## 2024-09-25 NOTE — Telephone Encounter (Signed)
 Patient's significant other is calling to let our office know that he will be out of the Oxycodone  5mg  on Sunday and would like to know if a refill can be sent in to [facility].

## 2024-09-25 NOTE — Telephone Encounter (Signed)
 DOS: 09/22/2024 L3-4 Lateral interbody Fusion(Right), L3-4 Posterior Spional Fusion with Dr. Claudene     He would be due for oxycodone  on Sunday 2/8. PMP reviewed and is appropriate.   Refill of oxycodone  sent to pharmacy to fill on 2/8. Will leave directions the same for now.   Please make sure he tolerating oxycodone  with no side effects (has listed allergy to benadryl /tylenol ).   Please let him know.

## 2024-09-30 ENCOUNTER — Other Ambulatory Visit

## 2024-09-30 ENCOUNTER — Encounter: Admitting: Neurosurgery

## 2024-10-07 ENCOUNTER — Encounter: Admitting: Physician Assistant

## 2024-11-04 ENCOUNTER — Encounter: Admitting: Neurosurgery

## 2024-11-04 ENCOUNTER — Other Ambulatory Visit

## 2024-11-09 ENCOUNTER — Other Ambulatory Visit

## 2024-11-09 ENCOUNTER — Encounter: Admitting: Physician Assistant

## 2024-12-14 ENCOUNTER — Other Ambulatory Visit

## 2024-12-14 ENCOUNTER — Other Ambulatory Visit (HOSPITAL_COMMUNITY)

## 2024-12-14 ENCOUNTER — Encounter: Admitting: Physician Assistant

## 2024-12-16 ENCOUNTER — Encounter: Admitting: Physician Assistant

## 2024-12-16 ENCOUNTER — Other Ambulatory Visit
